# Patient Record
Sex: Female | Born: 1975 | Hispanic: No | Smoking: Never smoker
Health system: Southern US, Community
[De-identification: ages and names within clinical notes are randomized; demographics above are authoritative.]

## PROBLEM LIST (undated history)

## (undated) DIAGNOSIS — A64 Unspecified sexually transmitted disease: Secondary | ICD-10-CM

## (undated) DIAGNOSIS — R519 Headache, unspecified: Secondary | ICD-10-CM

## (undated) DIAGNOSIS — Z8669 Personal history of other diseases of the nervous system and sense organs: Secondary | ICD-10-CM

## (undated) DIAGNOSIS — T7840XA Allergy, unspecified, initial encounter: Secondary | ICD-10-CM

## (undated) DIAGNOSIS — F419 Anxiety disorder, unspecified: Secondary | ICD-10-CM

## (undated) DIAGNOSIS — F329 Major depressive disorder, single episode, unspecified: Secondary | ICD-10-CM

## (undated) DIAGNOSIS — R87619 Unspecified abnormal cytological findings in specimens from cervix uteri: Secondary | ICD-10-CM

## (undated) DIAGNOSIS — J45909 Unspecified asthma, uncomplicated: Secondary | ICD-10-CM

## (undated) DIAGNOSIS — R51 Headache: Secondary | ICD-10-CM

## (undated) DIAGNOSIS — F32A Depression, unspecified: Secondary | ICD-10-CM

## (undated) HISTORY — DX: Unspecified abnormal cytological findings in specimens from cervix uteri: R87.619

## (undated) HISTORY — PX: REDUCTION MAMMAPLASTY: SUR839

## (undated) HISTORY — DX: Personal history of other diseases of the nervous system and sense organs: Z86.69

## (undated) HISTORY — DX: Unspecified asthma, uncomplicated: J45.909

## (undated) HISTORY — DX: Allergy, unspecified, initial encounter: T78.40XA

## (undated) HISTORY — PX: BREAST EXCISIONAL BIOPSY: SUR124

## (undated) HISTORY — DX: Unspecified sexually transmitted disease: A64

## (undated) HISTORY — PX: COLPOSCOPY: SHX161

## (undated) HISTORY — PX: BREAST BIOPSY: SHX20

---

## 2001-08-10 HISTORY — PX: BREAST REDUCTION SURGERY: SHX8

## 2003-08-11 HISTORY — PX: OTHER SURGICAL HISTORY: SHX169

## 2004-08-10 DIAGNOSIS — R87619 Unspecified abnormal cytological findings in specimens from cervix uteri: Secondary | ICD-10-CM

## 2004-08-10 HISTORY — DX: Unspecified abnormal cytological findings in specimens from cervix uteri: R87.619

## 2012-05-12 ENCOUNTER — Other Ambulatory Visit: Payer: Self-pay | Admitting: Gynecology

## 2012-05-12 DIAGNOSIS — N644 Mastodynia: Secondary | ICD-10-CM

## 2012-05-16 ENCOUNTER — Ambulatory Visit
Admission: RE | Admit: 2012-05-16 | Discharge: 2012-05-16 | Disposition: A | Discharge status: Home / Self Care | Payer: Private Health Insurance - Indemnity | Source: Ambulatory Visit | Attending: Gynecology | Admitting: Gynecology

## 2012-05-16 DIAGNOSIS — N644 Mastodynia: Secondary | ICD-10-CM

## 2012-05-24 ENCOUNTER — Ambulatory Visit (INDEPENDENT_AMBULATORY_CARE_PROVIDER_SITE_OTHER): Payer: Private Health Insurance - Indemnity | Admitting: Family

## 2012-05-24 ENCOUNTER — Encounter: Payer: Self-pay | Admitting: Family

## 2012-05-24 VITALS — BP 124/80 | HR 122 | Temp 102.0°F | Ht 66.0 in | Wt 206.0 lb

## 2012-05-24 DIAGNOSIS — M791 Myalgia, unspecified site: Secondary | ICD-10-CM

## 2012-05-24 DIAGNOSIS — IMO0001 Reserved for inherently not codable concepts without codable children: Secondary | ICD-10-CM

## 2012-05-24 DIAGNOSIS — R509 Fever, unspecified: Secondary | ICD-10-CM

## 2012-05-24 LAB — CBC WITH DIFFERENTIAL/PLATELET
Basophils Absolute: 0 10*3/uL (ref 0.0–0.1)
Eosinophils Absolute: 0.1 10*3/uL (ref 0.0–0.7)
Eosinophils Relative: 1.3 % (ref 0.0–5.0)
HCT: 42.6 % (ref 36.0–46.0)
Lymphs Abs: 0.8 10*3/uL (ref 0.7–4.0)
MCHC: 32 g/dL (ref 30.0–36.0)
MCV: 85 fl (ref 78.0–100.0)
Monocytes Absolute: 0.7 10*3/uL (ref 0.1–1.0)
Neutrophils Relative %: 82.5 % — ABNORMAL HIGH (ref 43.0–77.0)
Platelets: 272 10*3/uL (ref 150.0–400.0)
RDW: 13.6 % (ref 11.5–14.6)
WBC: 9.2 10*3/uL (ref 4.5–10.5)

## 2012-05-24 LAB — BASIC METABOLIC PANEL
Calcium: 9 mg/dL (ref 8.4–10.5)
GFR: 118.38 mL/min (ref >60.00)
Glucose, Bld: 91 mg/dL (ref 70–99)
Potassium: 3.9 mEq/L (ref 3.5–5.1)
Sodium: 137 mEq/L (ref 135–145)

## 2012-05-24 LAB — POCT INFLUENZA A/B
Influenza A, POC: NEGATIVE
Influenza B, POC: NEGATIVE

## 2012-05-24 MED ORDER — AZITHROMYCIN 250 MG PO TABS: ORAL_TABLET | ORAL | Status: DC

## 2012-05-24 MED ORDER — HYDROCOD POLST-CHLORPHEN POLST 10-8 MG/5ML PO LQCR: 5.0000 mL | Freq: Two times a day (BID) | ORAL | Status: DC | PRN

## 2012-05-24 NOTE — Progress Notes (Signed)
Subjective:    Patient ID: Audrey Jackson, female    DOB: 08-19-1975, 36 y.o.   MRN: [id number]  HPI 36 year old new patient to the practice is in today with complaints of headache, fever, chills, cough, runny nose, decreased appetite x3 days. She's getting progressively worse. His taken over-the-counter Tylenol Cold and sinus, TheraFlu, Alka-Seltzer plus with no relief. She's a nonsmoker. Hurts in her chest when she coughs. She accompanied today by her 35 year old son.    Review of Systems  Constitutional: Positive for fever, appetite change and fatigue.  HENT: Positive for rhinorrhea.   Respiratory: Positive for cough.   Cardiovascular: Negative.   Gastrointestinal: Negative.  Negative for nausea, vomiting and abdominal pain.  Genitourinary: Negative.   Musculoskeletal: Negative.   Skin: Negative.   Neurological: Positive for headaches.  Psychiatric/Behavioral: Negative.    Past Medical History  Diagnosis Date  . Allergy   . Asthma   . Hx of migraines     History   Social History  . Marital Status: Divorced    Spouse Name: N/A    Number of Children: N/A  . Years of Education: N/A   Occupational History  . Not on file.   Social History Main Topics  . Smoking status: Never Smoker   . Smokeless tobacco: Not on file  . Alcohol Use: Yes  . Drug Use: No  . Sexually Active: Not on file   Other Topics Concern  . Not on file   Social History Narrative  . No narrative on file    Past Surgical History  Procedure Date  . Brain surgery     reduction    Family History  Problem Relation Age of Onset  . Alcohol abuse Mother   . Alcohol abuse Father   . Drug abuse Father   . Depression Father   . Bipolar disorder Father   . Diabetes Father   . Cancer Maternal Grandmother     breast    No Known Allergies  Current Outpatient Prescriptions on File Prior to Visit  Medication Sig Dispense Refill  . beclomethasone (QVAR) 80 MCG/ACT inhaler Inhale 1 puff into the  lungs as needed.      [name] Kitchen levocetirizine (XYZAL) 5 MG tablet Take 5 mg by mouth every evening.      . montelukast (SINGULAIR) 10 MG tablet Take 10 mg by mouth at bedtime.        BP 124/80  Pulse 122  Temp 102 F (38.9 C) (Oral)  Ht 5\' 6"  (1.676 m)  Wt 206 lb (93.441 kg)  BMI 33.25 kg/m2  SpO2 98%  LMP 10/12/2013chart    Objective:   Physical Exam  Constitutional: She is oriented to person, place, and time. She appears well-developed and well-nourished.  HENT:  Right Ear: External ear normal.  Left Ear: External ear normal.  Nose: Nose normal.  Mouth/Throat: Oropharynx is clear and moist.  Neck: Normal range of motion. Neck supple.  Cardiovascular: Normal rate, regular rhythm and normal heart sounds.   Pulmonary/Chest: Effort normal and breath sounds normal. She has no wheezes.  Abdominal: Soft. Bowel sounds are normal.  Neurological: She is alert and oriented to person, place, and time.  Skin: Skin is warm and dry.  Psychiatric: She has a normal mood and affect.          Assessment & Plan:  Assessment: Suspected influenza, myalgias, fever  Plan: Obtain CBC and BMP will notify patient pending results. Ordinarily I would send for CXR but  her son is unable to drive to [place] location. If no better, we will obtain CXR.  Will treat patient empirically for pneimonia. Zpak as directed. Tussionex as needed for cough.

## 2012-05-24 NOTE — Patient Instructions (Addendum)
Influenza Facts  Flu (influenza) is a contagious respiratory illness caused by the influenza viruses. It can cause mild to severe illness. While most healthy people recover from the flu without specific treatment and without complications, older people, young children, and people with certain health conditions are at higher risk for serious complications from the flu, including death.  CAUSES    The flu virus is spread from person to person by respiratory droplets from coughing and sneezing.   A person can also become infected by touching an object or surface with a virus on it and then touching their mouth, eye or nose.   Adults may be able to infect others from 1 day before symptoms occur and up to 7 days after getting sick. So it is possible to give someone the flu even before you know you are sick and continue to infect others while you are sick.  SYMPTOMS    Fever (usually high).   Headache.   Tiredness (can be extreme).   Cough.   Sore throat.   Runny or stuffy nose.   Body aches.   Diarrhea and vomiting may also occur, particularly in children.   These symptoms are referred to as "flu-like symptoms". A lot of different illnesses, including the common cold, can have similar symptoms.  DIAGNOSIS    There are tests that can determine if you have the flu as long you are tested within the first 2 or 3 days of illness.   A doctor's exam and additional tests may be needed to identify if you have a disease that is a complicating the flu.  RISKS AND COMPLICATIONS   Some of the complications caused by the flu include:   Bacterial pneumonia or progressive pneumonia caused by the flu virus.   Loss of body fluids (dehydration).   Worsening of chronic medical conditions, such as heart failure, asthma, or diabetes.   Sinus problems and ear infections.  HOME CARE INSTRUCTIONS    Seek medical care early on.   If you are at high risk from complications of the flu, consult your health-care provider as soon  as you develop flu-like symptoms. Those at high risk for complications include:   People 65 years or older.   People with chronic medical conditions, including diabetes.   Pregnant women.   Young children.   Your caregiver may recommend use of an antiviral medication to help treat the flu.   If you get the flu, get plenty of rest, drink a lot of liquids, and avoid using alcohol and tobacco.   You can take over-the-counter medications to relieve the symptoms of the flu if your caregiver approves. (Never give aspirin to children or teenagers who have flu-like symptoms, particularly fever).  PREVENTION   The single best way to prevent the flu is to get a flu vaccine each fall. Other measures that can help protect against the flu are:   Antiviral Medications   A number of antiviral drugs are approved for use in preventing the flu. These are prescription medications, and a doctor should be consulted before they are used.   Habits for Good Health   Cover your nose and mouth with a tissue when you cough or sneeze, throw the tissue away after you use it.   Wash your hands often with soap and water, especially after you cough or sneeze. If you are not near water, use an alcohol-based hand cleaner.   Avoid people who are sick.   If you get the   flu, stay home from work or school. Avoid contact with other people so that you do not make them sick, too.   Try not to touch your eyes, nose, or mouth as germs ore often spread this way.  IN CHILDREN, EMERGENCY WARNING SIGNS THAT NEED URGENT MEDICAL ATTENTION:   Fast breathing or trouble breathing.   Bluish skin color.   Not drinking enough fluids.   Not waking up or not interacting.   Being so irritable that the child does not want to be held.   Flu-like symptoms improve but then return with fever and worse cough.   Fever with a rash.  IN ADULTS, EMERGENCY WARNING SIGNS THAT NEED URGENT MEDICAL ATTENTION:   Difficulty breathing or shortness of breath.   Pain  or pressure in the chest or abdomen.   Sudden dizziness.   Confusion.   Severe or persistent vomiting.  SEEK IMMEDIATE MEDICAL CARE IF:   You or someone you know is experiencing any of the symptoms above. When you arrive at the emergency center,report that you think you have the flu. You may be asked to wear a mask and/or sit in a secluded area to protect others from getting sick.  MAKE SURE YOU:    Understand these instructions.   Monitor your condition.   Seek medical care if you are getting worse, or not improving.  Document Released: 07/30/2003 Document Revised: 10/19/2011 Document Reviewed: 04/25/2009  ExitCare Patient Information 2013 [employer].

## 2012-06-28 ENCOUNTER — Ambulatory Visit (INDEPENDENT_AMBULATORY_CARE_PROVIDER_SITE_OTHER): Payer: Private Health Insurance - Indemnity | Admitting: Family

## 2012-06-28 ENCOUNTER — Encounter: Payer: Self-pay | Admitting: Family

## 2012-06-28 DIAGNOSIS — M436 Torticollis: Secondary | ICD-10-CM

## 2012-06-28 MED ORDER — DICLOFENAC SODIUM 75 MG PO TBEC: 75.0000 mg | DELAYED_RELEASE_TABLET | Freq: Two times a day (BID) | ORAL | Status: DC

## 2012-06-28 MED ORDER — CYCLOBENZAPRINE HCL 10 MG PO TABS: 10.0000 mg | ORAL_TABLET | Freq: Three times a day (TID) | ORAL | Status: DC | PRN
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
## 2012-06-30 ENCOUNTER — Telehealth: Payer: Self-pay | Admitting: Family
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
## 2012-08-26 ENCOUNTER — Ambulatory Visit (INDEPENDENT_AMBULATORY_CARE_PROVIDER_SITE_OTHER)
Admission: RE | Admit: 2012-08-26 | Discharge: 2012-08-26 | Disposition: A | Discharge status: Home / Self Care | Payer: Private Health Insurance - Indemnity | Source: Ambulatory Visit | Attending: Family | Admitting: Family

## 2012-08-26 ENCOUNTER — Ambulatory Visit (INDEPENDENT_AMBULATORY_CARE_PROVIDER_SITE_OTHER): Payer: Private Health Insurance - Indemnity | Admitting: Family

## 2012-08-26 ENCOUNTER — Encounter: Payer: Self-pay | Admitting: Family

## 2012-08-26 VITALS — BP 114/80 | HR 107 | Wt 213.0 lb

## 2012-08-26 DIAGNOSIS — M25519 Pain in unspecified shoulder: Secondary | ICD-10-CM

## 2012-08-26 MED ORDER — DICLOFENAC SODIUM 75 MG PO TBEC: 75.0000 mg | DELAYED_RELEASE_TABLET | Freq: Two times a day (BID) | ORAL | Status: DC

## 2012-08-26 MED ORDER — KETOROLAC TROMETHAMINE 60 MG/2ML IM SOLN
60.0000 mg | Freq: Once | INTRAMUSCULAR | Status: AC
  Administered 2012-08-26: 60 mg via INTRAMUSCULAR
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
## 2012-08-26 NOTE — Patient Instructions (Addendum)
Strain A strain is an injury to a muscle or the tissue that connects muscles to bones (tendon). In a strain injury, the muscle or tendon is either stretched or torn. Muscles are more susceptible to strains if they cross two joints, such as:  Hamstrings.  Quadriceps.  Calves.  Biceps. There are three categories of strains:  A first-degree strain is a small tear in the muscle. There is no lengthening of the muscle, but pain may be present with contraction of the muscle.  A second-degree strain is a small tear in the muscle accompanied by lengthening of the muscle. Muscles with a second-degree strain are still able to function.  A third-degree strain is a complete tear of the muscle. Muscles with a third-degree strain cannot function properly. Strains often have bleeding and bruising within the muscle. SYMPTOMS   Pain, tenderness, redness or bruising, and swelling in the area of injury.  Loss of normal mobility of the injured joint. CAUSES  A sudden force exerted on a muscle or tendon that it cannot withstand usually causes strains. This may be due to a sudden overload of a contracted muscle, overuse, or sudden increase or change in activity.  RISK INCREASES WITH:  Trauma.  Poor strength and flexibility.  Failure to warm-up properly before activity.  Return to activity before healing is complete. PREVENTION  Warm-up and stretch properly before and activity.  Maintain physical fitness:  Joint flexibility.  Muscle strength.  Endurance and conditioning.  Strengthen weak muscles with exercises to prevent recurrence. PROGNOSIS  If treated properly, strains are usually curable. The time it takes to recover is related to the severity of the injury and usually varies from 2 to 8 weeks. RELATED COMPLICATIONS   Re-injury or recurrence of symptoms, permanent weakness.  Joint stiffness if the strain is severe and rehabilitation is incomplete.  Delayed healing or resolution of  symptoms if sports are resumed before rehabilitation is complete.  Excessive bleeding into muscle, especially if taking anti-inflammatory medicines. This can lead to delayed recovery and injury to nerves, muscle, and blood vessels; this is an emergency. TREATMENT  Treatment initially involves ice and medicine to help reduce pain and inflammation. Use of the affected muscle should be limited by a:  Brace.  Elastic bandage wrapping.  Splint.  Cast.  Sling. Strengthening and stretching exercises may be necessary after immobilization to prevent joint stiffness. These exercises may be completed at home or with a therapist. If the tendon is torn, then surgery may be necessary to repair it.  MEDICATION   Avoid aspirin or ibuprofen in the first 48 hours after the injury. These medicines may increase the tendency to bleed. During this time, you may take pain relievers, such as acetaminophen, that do not affect bleeding.  After the first 48 hours, if pain medicine is necessary, then nonsteroidal anti-inflammatory medicines, such as aspirin and ibuprofen, or other minor pain relievers, such as acetaminophen, are often recommended.  Do not take pain medicine within 7 days before surgery.  Prescription pain relievers may be prescribed. Use only as directed and only as much as you need  Ointments applied to the skin may be helpful. HEAT AND COLD  Cold treatment (icing) relieves pain and reduces inflammation. Cold treatment should be applied for 10 to 15 minutes every 2 to 3 hours for inflammation and pain and immediately after any activity that aggravates your symptoms. Use ice packs or massage the area with a piece of ice (ice massage).  Heat treatment may be   used prior to performing the stretching and strengthening activities prescribed by your caregiver, physical therapist, or athletic trainer. Use a heat pack or soak your injury in warm water. SEEK MEDICAL CARE IF:   Symptoms get worse or do  not improve despite treatment.  Pain becomes intolerable.  You experience numbness or tingling.  Toes or fingernails become cold or develop a blue, gray, or dusky color.  New, unexplained symptoms develop (drugs used in treatment may produce side effects). Document Released: 07/27/2005 Document Revised: 10/19/2011 Document Reviewed: 11/08/2008 Exit[employer] Patient Information 2013 [employer].  

## 2012-11-14 ENCOUNTER — Encounter: Payer: Self-pay | Admitting: Family

## 2012-11-14 ENCOUNTER — Ambulatory Visit (INDEPENDENT_AMBULATORY_CARE_PROVIDER_SITE_OTHER): Payer: Private Health Insurance - Indemnity | Admitting: Family

## 2012-11-14 VITALS — BP 98/60 | HR 88 | Temp 99.1°F | Wt 202.0 lb

## 2012-11-14 DIAGNOSIS — J309 Allergic rhinitis, unspecified: Secondary | ICD-10-CM

## 2012-11-14 DIAGNOSIS — J320 Chronic maxillary sinusitis: Secondary | ICD-10-CM

## 2012-11-14 MED ORDER — AZITHROMYCIN 250 MG PO TABS: ORAL_TABLET | ORAL | Status: DC

## 2012-11-14 MED ORDER — METHYLPREDNISOLONE 4 MG PO KIT: PACK | ORAL | Status: AC

## 2012-11-14 MED ORDER — FLUCONAZOLE 150 MG PO TABS: 150.0000 mg | ORAL_TABLET | Freq: Once | ORAL | Status: DC

## 2012-11-14 NOTE — Progress Notes (Signed)
Subjective:    Patient ID: Audrey Jackson, female    DOB: 1976-05-09, 37 y.o.   MRN: [id number]  HPI 37 year old [place] American female, nonsmoker is in with complaints of sneezing, cough, congestion, sinus pressure and pain, fever ongoing x9 days. She chronically takes size all and Singulair for asthma and allergies. Her symptoms are no better.   Review of Systems  Constitutional: Negative.   HENT: Positive for congestion, sore throat and sinus pressure.   Respiratory: Positive for cough. Negative for wheezing.   Cardiovascular: Negative.   Gastrointestinal: Negative.   Skin: Negative.   Allergic/Immunologic: Positive for environmental allergies.  Hematological: Negative.   Psychiatric/Behavioral: Negative.    Past Medical History  Diagnosis Date  . Allergy   . Asthma   . Hx of migraines     History   Social History  . Marital Status: Divorced    Spouse Name: N/A    Number of Children: N/A  . Years of Education: N/A   Occupational History  . Not on file.   Social History Main Topics  . Smoking status: Never Smoker   . Smokeless tobacco: Not on file  . Alcohol Use: Yes  . Drug Use: No  . Sexually Active: Not on file   Other Topics Concern  . Not on file   Social History Narrative  . No narrative on file    Past Surgical History  Procedure Laterality Date  . Brain surgery      reduction    Family History  Problem Relation Age of Onset  . Alcohol abuse Mother   . Alcohol abuse Father   . Drug abuse Father   . Depression Father   . Bipolar disorder Father   . Diabetes Father   . Cancer Maternal Grandmother     breast    No Known Allergies  Current Outpatient Prescriptions on File Prior to Visit  Medication Sig Dispense Refill  . beclomethasone (QVAR) 80 MCG/ACT inhaler Inhale 1 puff into the lungs as needed.      . cyclobenzaprine (FLEXERIL) 10 MG tablet Take 1 tablet (10 mg total) by mouth 3 (three) times daily as needed for muscle spasms.  30  tablet  0  . levocetirizine (XYZAL) 5 MG tablet Take 5 mg by mouth every evening.      . montelukast (SINGULAIR) 10 MG tablet Take 10 mg by mouth at bedtime.      . chlorpheniramine-HYDROcodone (TUSSIONEX PENNKINETIC ER) 10-8 MG/5ML LQCR Take 5 mLs by mouth every 12 (twelve) hours as needed.  140 mL  0  . diclofenac (VOLTAREN) 75 MG EC tablet Take 1 tablet (75 mg total) by mouth 2 (two) times daily.  60 tablet  1   No current facility-administered medications on file prior to visit.    BP 98/60  Pulse 88  Temp(Src) 99.1 F (37.3 C) (Oral)  Wt 202 lb (91.627 kg)  BMI 32.62 kg/m2  SpO2 98%chart    Objective:   Physical Exam  Constitutional: She is oriented to person, place, and time. She appears well-developed and well-nourished.  HENT:  Right Ear: External ear normal.  Left Ear: External ear normal.  Mouth/Throat: Oropharynx is clear and moist.  Nasal congestion noted bilaterally. Turbinates red and swollen.  Neck: Neck supple.  Cardiovascular: Normal rate, regular rhythm and normal heart sounds.   Pulmonary/Chest: Effort normal and breath sounds normal.  Neurological: She is alert and oriented to person, place, and time.  Skin: Skin is warm and dry.  Psychiatric: She has a normal mood and affect.          Assessment & Plan:  Assessment:  1. Acute sinusitis 2. Allergic rhinitis  Plan: Z-Pak as directed. Medrol Dosepak as directed. Diflucan 150 mg one by mouth x1 as needed for vaginal yeast infection. Patient call the office if symptoms worsen or persist. Recheck a schedule, and as needed.

## 2012-11-14 NOTE — Patient Instructions (Addendum)

## 2012-11-29 ENCOUNTER — Telehealth: Payer: Self-pay | Admitting: Family

## 2012-11-29 ENCOUNTER — Ambulatory Visit: Payer: Self-pay | Admitting: Family

## 2012-11-29 NOTE — Telephone Encounter (Signed)
Patient Information:  Caller Name: Bentlie  Phone: [phone]  Patient: Audrey Jackson  Gender: Female  DOB: 01/27/1976  Age: 37 Years  PCP: Adline Mango [facility])  Pregnant: No  Office Follow Up:  Does the office need to follow up with this patient?: No  Instructions For The Office: N/A  RN Note:  pt also reports she has just gotten back from a cruise (1 week ago) and she is still having vertigo symptoms  Symptoms  Reason For Call & Symptoms: seen in the office 3 weeks ago (11/14/12)  for sinus infection:  Sneezing, coughing, pressure  Reviewed Health History In EMR: Yes  Reviewed Medications In EMR: Yes  Reviewed Allergies In EMR: Yes  Reviewed Surgeries / Procedures: Yes  Date of Onset of Symptoms: 11/22/2012 OB / GYN:  LMP: 11/17/2012  Guideline(s) Used:  Sinus Pain and Congestion  Disposition Per Guideline:   See Today in Office  Reason For Disposition Reached:   Earache  Advice Given:  N/A  Patient Will Follow Care Advice:  YES  Appointment Scheduled:  11/29/2012 14:30:00 Appointment Scheduled Provider:  Adline Mango [facility])

## 2013-01-25 ENCOUNTER — Other Ambulatory Visit: Payer: Self-pay | Admitting: Family

## 2013-01-25 DIAGNOSIS — J329 Chronic sinusitis, unspecified: Secondary | ICD-10-CM

## 2013-06-14 ENCOUNTER — Encounter: Payer: Self-pay | Admitting: Gynecology

## 2013-06-16 ENCOUNTER — Encounter: Payer: Self-pay | Admitting: Gynecology

## 2013-06-16 ENCOUNTER — Ambulatory Visit (INDEPENDENT_AMBULATORY_CARE_PROVIDER_SITE_OTHER): Payer: BC Managed Care – PPO | Admitting: Gynecology

## 2013-06-16 VITALS — BP 110/74 | Resp 12 | Ht 66.5 in | Wt 212.0 lb

## 2013-06-16 DIAGNOSIS — Z9889 Other specified postprocedural states: Secondary | ICD-10-CM

## 2013-06-16 DIAGNOSIS — G43109 Migraine with aura, not intractable, without status migrainosus: Secondary | ICD-10-CM | POA: Insufficient documentation

## 2013-06-16 DIAGNOSIS — Z01419 Encounter for gynecological examination (general) (routine) without abnormal findings: Secondary | ICD-10-CM

## 2013-06-16 DIAGNOSIS — Z3009 Encounter for other general counseling and advice on contraception: Secondary | ICD-10-CM

## 2013-06-16 NOTE — Patient Instructions (Signed)

## 2013-06-16 NOTE — Progress Notes (Signed)
37 y.o. Divorced [place] American female   [phone] here for annual exam. Pt is currently sexually active.  Pt is interested in replacing Mirena IUD which was removed on time, no issues.  No partner change, negative GC/CTM.  Migraines stable, stopped allergy medications last 90m-doing well  Patient's last menstrual period was 06/12/2013.          Sexually active: yes  The current method of family planning is condoms  Exercising: no  The patient does not participate in regular exercise at present. Last pap: 05/03/12 NEG HPV Alcohol: 1/wk Tobacco: no BSE: yes  Hgb: ; Urine: Negative    Health Maintenance  Topic Date Due  . Influenza Vaccine  03/10/2013  . Pap Smear  05/04/2015  . Tetanus/tdap  05/25/2019    Family History  Problem Relation Age of Onset  . Alcohol abuse Mother   . Alcohol abuse Father   . Drug abuse Father   . Depression Father   . Bipolar disorder Father   . Diabetes Father   . Breast cancer Maternal Grandmother     There are no active problems to display for this patient.   Past Medical History  Diagnosis Date  . Allergy   . Asthma   . Hx of migraines   . STD (sexually transmitted disease) 20 years ago    Chamydia  . History of migraine     Past Surgical History  Procedure Laterality Date  . Brain surgery      reduction  . Breast reduction surgery  2003     H to DD  . Breast biopsy      x2  . 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  Current Outpatient Prescriptions  Medication Sig Dispense Refill  . azithromycin (ZITHROMAX) 250 MG tablet 2 tabs po x 1, then 1 tab a day x 4 more days.  6 tablet  0  . beclomethasone (QVAR) 80 MCG/ACT inhaler Inhale 1 puff into the lungs as needed.      . chlorpheniramine-HYDROcodone (TUSSIONEX PENNKINETIC ER) 10-8 MG/5ML LQCR Take 5 mLs by mouth every 12 (twelve) hours as needed.  140 mL  0  . cyclobenzaprine (FLEXERIL) 10 MG tablet Take  1 tablet (10 mg total) by mouth 3 (three) times daily as needed for muscle spasms.  30 tablet  0  . diclofenac (VOLTAREN) 75 MG EC tablet Take 1 tablet (75 mg total) by mouth 2 (two) times daily.  60 tablet  1  . fluconazole (DIFLUCAN) 150 MG tablet Take 1 tablet (150 mg total) by mouth once.  1 tablet  0  . levocetirizine (XYZAL) 5 MG tablet Take 5 mg by mouth every evening.      . montelukast (SINGULAIR) 10 MG tablet Take 10 mg by mouth at bedtime.      . Oxymetazoline HCl (QC NASAL RELIEF SINUS NA) Place into the nose.       No current facility-administered medications for this visit.    ROS: Pertinent items are noted in HPI.  Exam:    BP 110/74  Resp 12  Ht 5' 6.5" (1.689 m)  Wt 212 lb (96.163 kg)  BMI 33.71 kg/m2  LMP 06/12/2013 Weight change: @WEIGHTCHANGE @ Last 3 height recordings:  Ht Readings from Last 3 Encounters:  06/16/13 5' 6.5" (1.689 m)  05/24/12 5\' 6"  (1.676 m)   General appearance: alert, cooperative and appears stated age Head: Normocephalic,  without obvious abnormality, atraumatic Neck: no adenopathy, no carotid bruit, no JVD, supple, symmetrical, trachea midline and thyroid not enlarged, symmetric, no tenderness/mass/nodules Lungs: clear to auscultation bilaterally Breasts: normal appearance, no masses or tenderness, s/p reduction Heart: regular rate and rhythm, S1, S2 normal, no murmur, click, rub or gallop Abdomen: soft, non-tender; bowel sounds normal; no masses,  no organomegaly Extremities: extremities normal, atraumatic, no cyanosis or edema Skin: Skin color, texture, turgor normal. No rashes or lesions Lymph nodes: Cervical, supraclavicular, and axillary nodes normal. no inguinal nodes palpated Neurologic: Grossly normal   Pelvic: External genitalia:  no lesions              Urethra: normal appearing urethra with no masses, tenderness or lesions              Bartholins and Skenes: normal                 Vagina: normal appearing vagina with normal  color and discharge, no lesions              Cervix: normal appearance              Pap taken: no        Bimanual Exam:  Uterus:  uterus is normal size, shape, consistency and nontender                                      Adnexa:    normal adnexa in size, nontender and no masses                                      Rectovaginal: Confirms                                      Anus:  normal sphincter tone, no lesions  A: well woman Contraceptive management     P: mammogram at 41 pap smear not done today Info on Mirena reiterated.  Will return with cycle counseled on breast self exam, adequate intake of calcium and vitamin D, diet and exercise return annually or prn   An After Visit Summary was printed and given to the patient.

## 2013-06-26 ENCOUNTER — Telehealth: Payer: Self-pay | Admitting: Gynecology

## 2013-06-26 NOTE — Telephone Encounter (Signed)
LMTCB to discuss insurance benefits for IUD insertion.

## 2013-06-27 NOTE — Telephone Encounter (Signed)
Spoke with patient. Agreeable to benefits and will call with the first day of her cycle.

## 2013-08-15 ENCOUNTER — Ambulatory Visit (INDEPENDENT_AMBULATORY_CARE_PROVIDER_SITE_OTHER): Payer: BC Managed Care – PPO | Admitting: Gynecology

## 2013-08-15 VITALS — BP 116/78 | HR 70 | Ht 66.5 in | Wt 215.0 lb

## 2013-08-15 DIAGNOSIS — N898 Other specified noninflammatory disorders of vagina: Secondary | ICD-10-CM

## 2013-08-15 DIAGNOSIS — R350 Frequency of micturition: Secondary | ICD-10-CM

## 2013-08-15 DIAGNOSIS — N3000 Acute cystitis without hematuria: Secondary | ICD-10-CM

## 2013-08-15 LAB — POCT URINALYSIS DIPSTICK
Nitrite, UA: NEGATIVE
Urobilinogen, UA: NEGATIVE
pH, UA: 5

## 2013-08-15 LAB — POCT WET PREP (WET MOUNT): Clue Cells Wet Prep Whiff POC: NEGATIVE

## 2013-08-15 MED ORDER — CIPROFLOXACIN HCL 500 MG PO TABS: 500.0000 mg | ORAL_TABLET | Freq: Two times a day (BID) | ORAL | Status: DC

## 2013-08-15 MED ORDER — FLUCONAZOLE 150 MG PO TABS: 150.0000 mg | ORAL_TABLET | Freq: Once | ORAL | Status: DC

## 2013-08-15 MED ORDER — PHENAZOPYRIDINE HCL 200 MG PO TABS: 200.0000 mg | ORAL_TABLET | Freq: Three times a day (TID) | ORAL | Status: DC | PRN

## 2013-08-15 NOTE — Progress Notes (Signed)
  Subjective:    Audrey Jackson is a 38 y.o. female who complains of burning with urination, frequency, hesitancy and urgency. She has had symptoms for 2 days. Patient also complains of none. Patient denies back pain, fever and vaginal discharge. Patient does not have a history of recurrent UTI. Patient does not have a history of pyelonephritis.   The following portions of the patient's history were reviewed and updated as appropriate: allergies, current medications, past family history, past medical history, past social history, past surgical history and problem list.  Review of Systems Pertinent items are noted in HPI.    Objective:    BP 116/78  Pulse 70  Ht 5' 6.5" (1.689 m)  Wt 215 lb (97.523 kg)  BMI 34.19 kg/m2 General appearance: alert, cooperative and appears stated age Back: no CVAT Abdomen: soft, no suprapubic tenderness Pelvic: cervix normal in appearance, external genitalia normal, no adnexal masses or tenderness, no cervical motion tenderness, rectovaginal septum normal, uterus normal size, shape, and consistency and thick white discharge  Laboratory:  Urine dipstick: 2+ for leukocyte esterase.   Micro exam: not done.    Assessment:    Acute cystitis     Plan:    Medications: ciprofloxacin. Maintain adequate hydration. Follow up if symptoms not improving, and as needed.  Pyridium Diflucan for yeast infection post abx

## 2013-08-16 LAB — URINALYSIS, MICROSCOPIC ONLY
CRYSTALS: NONE SEEN
Casts: NONE SEEN

## 2013-08-16 LAB — URINE CULTURE
COLONY COUNT: NO GROWTH
ORGANISM ID, BACTERIA: NO GROWTH

## 2013-08-28 ENCOUNTER — Telehealth: Payer: Self-pay | Admitting: Gynecology

## 2013-08-28 NOTE — Telephone Encounter (Signed)
Pt started cycle on Saturday and calling to schedule iud procedure.

## 2013-08-28 NOTE — Telephone Encounter (Signed)
Called patient. Offered her an appointment for procedure tomorrow morning 01.20.2015 @9 :30. Patient declined appointment stating that she is only available today for an appointment. I explained that Dr. Charlies Constable schedule is completely booked today. She will call back with next cycle.

## 2013-09-12 ENCOUNTER — Ambulatory Visit (INDEPENDENT_AMBULATORY_CARE_PROVIDER_SITE_OTHER): Payer: BC Managed Care – PPO | Admitting: Gynecology

## 2013-09-12 ENCOUNTER — Telehealth: Payer: Self-pay | Admitting: Gynecology

## 2013-09-12 ENCOUNTER — Encounter: Payer: Self-pay | Admitting: Gynecology

## 2013-09-12 ENCOUNTER — Other Ambulatory Visit (INDEPENDENT_AMBULATORY_CARE_PROVIDER_SITE_OTHER): Payer: BC Managed Care – PPO

## 2013-09-12 ENCOUNTER — Other Ambulatory Visit: Payer: Self-pay | Admitting: *Deleted

## 2013-09-12 VITALS — BP 129/77 | HR 78 | Temp 98.7°F | Resp 20 | Ht 66.5 in | Wt 214.0 lb

## 2013-09-12 DIAGNOSIS — R102 Pelvic and perineal pain: Secondary | ICD-10-CM

## 2013-09-12 DIAGNOSIS — N9489 Other specified conditions associated with female genital organs and menstrual cycle: Secondary | ICD-10-CM

## 2013-09-12 DIAGNOSIS — N949 Unspecified condition associated with female genital organs and menstrual cycle: Secondary | ICD-10-CM

## 2013-09-12 LAB — CBC WITH DIFFERENTIAL/PLATELET
HCT: 42.4 % (ref 36.0–46.0)
Hemoglobin: 13.3 g/dL (ref 12.0–15.0)
Lymphocytes Relative: 26 % (ref 12–46)
Lymphs Abs: 2.3 10*3/uL (ref 0.7–4.0)
MCH: 25.6 pg — ABNORMAL LOW (ref 26.0–34.0)
MCHC: 31.4 g/dL (ref 30.0–36.0)
MCV: 81.7 fL (ref 78.0–100.0)
Monocytes Absolute: 0.6 10*3/uL (ref 0.1–1.0)
Monocytes Relative: 7 % (ref 3–12)
Neutro Abs: 5.8 10*3/uL (ref 1.7–7.7)
Neutrophils Relative %: 67 % (ref 43–77)
Platelets: 309 10*3/uL (ref 150–400)
RBC: 5.19 MIL/uL — ABNORMAL HIGH (ref 3.87–5.11)
RDW: 13.6 % (ref 11.5–15.5)
WBC: 8.7 10*3/uL (ref 4.0–10.5)

## 2013-09-12 LAB — POCT URINALYSIS DIPSTICK
BILIRUBIN UA: NEGATIVE
Glucose, UA: NEGATIVE
Ketones, UA: NEGATIVE
LEUKOCYTES UA: NEGATIVE
Nitrite, UA: NEGATIVE
PH UA: 8
Protein, UA: NEGATIVE
RBC UA: NEGATIVE
Urobilinogen, UA: NEGATIVE

## 2013-09-12 MED ORDER — HYDROCODONE-ACETAMINOPHEN 5-325 MG PO TABS: 1.0000 | ORAL_TABLET | Freq: Four times a day (QID) | ORAL | Status: DC | PRN

## 2013-09-12 MED ORDER — METOCLOPRAMIDE HCL 10 MG PO TABS: 10.0000 mg | ORAL_TABLET | Freq: Four times a day (QID) | ORAL | Status: DC | PRN

## 2013-09-12 NOTE — Telephone Encounter (Signed)
Patient says she is having a lot of pelvic pain and back pain (feels like labor  NOT PREGNANT).

## 2013-09-12 NOTE — Addendum Note (Signed)
Addended by: Elveria Rising on: 09/12/2013 04:22 PM   Modules accepted: Level of Service

## 2013-09-12 NOTE — Addendum Note (Signed)
Addended by: Elveria Rising on: 09/12/2013 02:36 PM   Modules accepted: Orders

## 2013-09-12 NOTE — Progress Notes (Addendum)
      Pt seen back later today, her u/s shows a cl cyst on the left where she is tender.  There is no free fluid in the pelvis, 4 small fibroids are noted, kidneys are normal. Images were reviewed with the patient,  Her cbc came back with normal wbc-8.7, normal differential, h/h 13.3/42.4, plt 309  abdomino-Pelvic pain. No evidence of acute infection, no acute pelvic process, normal wbc and urine. Pt assured We will treat with vicoden and reglan prn. We have asked her to return to office on 09/15/13, to call before if she gets worse, she agrees

## 2013-09-12 NOTE — Telephone Encounter (Signed)
Patient c/o four days of pelvic pain, radiates to back pain with bloating and feelings of pain/pressure when urinating. On Cipro one month ago for UTI. States pain has worsened over last two days.  Appointment scheduled for 11:30.  Called patient back and left message to come to office as soon as she can for work in.   Routing to provider for final review. Patient agreeable to disposition. Will close encounter

## 2013-09-12 NOTE — Telephone Encounter (Signed)
There is now a 1000 on Friday, offer this or 1100.

## 2013-09-12 NOTE — Telephone Encounter (Signed)
Appointment time corrected in computer.    Routing to provider for final review. Patient agreeable to disposition. Will close encounter

## 2013-09-12 NOTE — Telephone Encounter (Signed)
Per Dr.Lathrop patient needs to come in on Friday 09/15/13 for a follow up. There are no available slots. Pt can come any time after 10 am on Friday. Please schedule.

## 2013-09-12 NOTE — Telephone Encounter (Signed)
Pt will come in at 11;00 am on Friday 09/15/13 there is not a slot for 11am so I put her in at the 10am slot/RD

## 2013-09-12 NOTE — Progress Notes (Addendum)
Subjective:     Patient ID: Audrey Jackson, female   DOB: 1975-11-15, 38 y.o.   MRN: [id number]  HPI Comments: Pt here reporting genernalized malaise for 4d.  Pt states that she has abdominal pain, back pain with abdominal distension.  Pt is having urinary urgency and dysuria small volume and incontinence.  Pt reports LMP cam e on time but that it only lasted 1d before it stopped, she was planning to get her IUD placed this cycle but didn;t due to the short nature of her cycle.   Pelvic Pain The patient's primary symptoms include pelvic pain. Associated symptoms include abdominal pain, back pain and diarrhea. Pertinent negatives include no chills, constipation, fever, flank pain, hematuria, nausea or vomiting.     Review of Systems  Constitutional: Positive for diaphoresis and appetite change (decreased). Negative for fever, chills and fatigue.  Gastrointestinal: Positive for abdominal pain, diarrhea and abdominal distention. Negative for nausea, vomiting and constipation.  Genitourinary: Positive for enuresis (cannot hold urine), difficulty urinating, pelvic pain and dyspareunia. Negative for hematuria and flank pain.  Musculoskeletal: Positive for back pain. Negative for arthralgias, joint swelling and myalgias.       Objective:   Physical Exam  Nursing note and vitals reviewed. Constitutional: She is oriented to person, place, and time. Vital signs are normal. She appears ill. She appears distressed (mild).  Cardiovascular: Normal rate, regular rhythm and normal heart sounds.   Pulmonary/Chest: Effort normal and breath sounds normal.  Abdominal: Soft. She exhibits distension. There is tenderness. There is rebound (left lower quadrant). There is no guarding.  Genitourinary: There is no rash, tenderness or lesion on the right labia. There is no rash, tenderness or lesion on the left labia. Uterus is tender. Cervix exhibits motion tenderness. Cervix exhibits no discharge and no friability.  Right adnexum displays no tenderness and no fullness. Left adnexum displays tenderness (guarding ) and fullness. There is tenderness (nonspecific) around the vagina.  Lymphadenopathy:       Right: No inguinal adenopathy present.       Left: No inguinal adenopathy present.  Neurological: She is alert and oriented to person, place, and time.  Skin: Skin is warm. She is diaphoretic (sweaty).       Assessment:     Abdomino-pelvic pain  Irregular cycle     Plan:     Cbc with diff stat GC/CTM from cervix PUS scheduled for this afternoon Pt to remain npo for now-agrees     Total time spent with pt between both exams > 47m, >50% face to face

## 2013-09-13 LAB — HCG, QUANTITATIVE, PREGNANCY

## 2013-09-14 LAB — IPS N GONORRHOEA AND CHLAMYDIA BY PCR

## 2013-09-15 ENCOUNTER — Encounter: Payer: Self-pay | Admitting: Gynecology

## 2013-09-15 ENCOUNTER — Ambulatory Visit (INDEPENDENT_AMBULATORY_CARE_PROVIDER_SITE_OTHER): Payer: BC Managed Care – PPO | Admitting: Gynecology

## 2013-09-15 VITALS — BP 102/72 | Temp 98.7°F | Resp 16 | Ht 66.5 in

## 2013-09-15 DIAGNOSIS — N949 Unspecified condition associated with female genital organs and menstrual cycle: Secondary | ICD-10-CM

## 2013-09-15 DIAGNOSIS — N710 Acute inflammatory disease of uterus: Secondary | ICD-10-CM

## 2013-09-15 DIAGNOSIS — R102 Pelvic and perineal pain: Secondary | ICD-10-CM

## 2013-09-15 DIAGNOSIS — D259 Leiomyoma of uterus, unspecified: Secondary | ICD-10-CM

## 2013-09-15 LAB — CBC WITH DIFFERENTIAL/PLATELET
BASOS ABS: 0 10*3/uL (ref 0.0–0.1)
Basophils Relative: 1 % (ref 0–1)
Eosinophils Absolute: 0.2 10*3/uL (ref 0.0–0.7)
Eosinophils Relative: 3 % (ref 0–5)
HEMATOCRIT: 38.2 % (ref 36.0–46.0)
Hemoglobin: 12.8 g/dL (ref 12.0–15.0)
LYMPHS ABS: 2.3 10*3/uL (ref 0.7–4.0)
LYMPHS PCT: 28 % (ref 12–46)
MCH: 26.8 pg (ref 26.0–34.0)
MCHC: 33.5 g/dL (ref 30.0–36.0)
MCV: 79.9 fL (ref 78.0–100.0)
MONO ABS: 0.7 10*3/uL (ref 0.1–1.0)
Monocytes Relative: 9 % (ref 3–12)
Neutro Abs: 5 10*3/uL (ref 1.7–7.7)
Neutrophils Relative %: 59 % (ref 43–77)
PLATELETS: 306 10*3/uL (ref 150–400)
RBC: 4.78 MIL/uL (ref 3.87–5.11)
RDW: 14.6 % (ref 11.5–15.5)
WBC: 8.3 10*3/uL (ref 4.0–10.5)

## 2013-09-15 MED ORDER — DOXYCYCLINE HYCLATE 100 MG PO CAPS: 100.0000 mg | ORAL_CAPSULE | Freq: Two times a day (BID) | ORAL | Status: DC

## 2013-09-15 MED ORDER — METRONIDAZOLE 500 MG PO TABS: 500.0000 mg | ORAL_TABLET | Freq: Two times a day (BID) | ORAL | Status: DC

## 2013-09-15 NOTE — Progress Notes (Signed)
Subjective:     Patient ID: Audrey Jackson, female   DOB: 1975-12-16, 38 y.o.   MRN: [id number]  HPI Comments: Pt asked to return to office today for follow up of nonspecific pelvic pain.  Pt had a normal wbc without shift, normal h/h, no acute process on PUS, negative u/a.  Pt was given vicoden which she has been using after work.  Pt states that she felt better yesterday but didn't feel well this am.  Pt had GC/CTM cultures from the cervix which returned negative today as well.  Pt denies nausea or vomiting.  She states pain is mostly in her back, no change in appetitie    Review of Systems  Constitutional: Negative for fever, chills and appetite change.  Gastrointestinal: Negative for nausea and vomiting.  Genitourinary: Negative for dysuria, frequency, vaginal bleeding and vaginal discharge.       Objective:   Physical Exam  Nursing note and vitals reviewed. Constitutional: She appears well-developed and well-nourished. No distress.  Abdominal: Soft. Bowel sounds are normal. She exhibits no distension and no mass. There is no tenderness. There is no rebound and no guarding.  Skin: Skin is warm and dry. She is not diaphoretic.  Pelvic exam: VULVA: normal appearing vulva with no masses, tenderness or lesions, VAGINA: vaginal tenderness , vaginal discharge - white, copious and thin, WET MOUNT done - results: negative for pathogens, normal epithelial cells, pH 4.5, CERVIX: normal appearing cervix without discharge or lesions, cervical motion tenderness present, UTERUS: tenderness moderate, ADNEXA: tenderness left, no fullness.      Assessment:     Nonspecific pelvic pain, negative evaluation to date     Plan:     Will repeat cbc with diff Presumptively treat for endometritis-flagyl and doxy f/u 1w or sooner if needed precautions given

## 2013-11-14 ENCOUNTER — Telehealth: Payer: Self-pay | Admitting: Gynecology

## 2013-11-14 NOTE — Telephone Encounter (Signed)
Patient started her menstrual cycle today and is wanting to get her IUD placed as soon as possible.

## 2013-11-14 NOTE — Telephone Encounter (Signed)
Spoke with patient. Patient started menses today and would like to schedule IUD insertion. Patient requesting 4/8 after 1 pm or before 1 pm on 4/10. Appointment made for 11:30 am on 4/10 time per Dr.Lathrop. Patient is agreeable to date and time. Pre procedure instructions given.  Motrin instructions given. Motrin=Advil=Ibuprofen, 800 mg one hour before appointment. Eat a meal and hydrate well before appointment. Patient verbalizes understanding.  Routing to provider for final review. Patient agreeable to disposition. Will close encounter.

## 2013-11-17 ENCOUNTER — Ambulatory Visit (INDEPENDENT_AMBULATORY_CARE_PROVIDER_SITE_OTHER): Payer: BC Managed Care – PPO | Admitting: Gynecology

## 2013-11-17 ENCOUNTER — Encounter: Payer: Self-pay | Admitting: Gynecology

## 2013-11-17 VITALS — BP 118/80 | Resp 20 | Ht 66.5 in | Wt 215.0 lb

## 2013-11-17 DIAGNOSIS — Z30430 Encounter for insertion of intrauterine contraceptive device: Secondary | ICD-10-CM

## 2013-11-17 DIAGNOSIS — Z3009 Encounter for other general counseling and advice on contraception: Secondary | ICD-10-CM

## 2013-11-17 NOTE — Patient Instructions (Signed)
IUD PLACEMENT POST PROCEDURE INSTRUCTIONS  1. You may take Ibuprofen, Aleve or Tylenol for pain as needed.      Cramping should resolve within 24 hours.  2. You may have a small amount of spotting. You should wear a mini pad for the next few days  3. You may have intercourse after 24 hours. If you are using this for birth control, it is effective immediately.  4. You need to call if you have any pelvic pain, fever, heavy bleeding or foul smelling vaginal discharge. Irregular bleeding is common the first several months after having an IUD placed. You do not need to for this reason unless you are                     concerned.   5. Shower or bathe as normal  6. You should have a follow-up appointment in 4-8 weeks for a re-check to make sure you are not having any problems.  

## 2013-11-17 NOTE — Progress Notes (Signed)
50 yrs Married [place] American female presents for  insertion of Mirena. Denies any vaginal symptoms or STD concerns.  LMP: 11/14/13  Patient read information regarding IUD insertion.  All questions addressed.    Healthy female,time, place and personnormal menses, no abnormal bleeding, pelvic pain or discharge, no breast pain or new or enlarging lumps on self exam Abdomen: soft, non-tender Groinno inguinal nodes palpated  Pelvic exam: Vulva;normal female genitalia  Vagina:normal vagina  Cervix:Non-tender, Negative CMT, no lesions or redness, nulliparous/parous os  Uterus:normal shape, position and consistency   Lab:no pathogens  Procedure:  Speculum inserted into vagina. Cervix visualized and cleansed with betadine solution X 3. Xylocaine jelly placed. Tenaculum placed on cervix at 12 o'clock position(s).  Uterus sounded to 9 centimeters.  IUD removed from sterile packet and under sterile conditions inserted to fundus of uterus.  Introducer removed without difficulty.  IUD string trimmed to 3 centimeters.  Remainder string given to patient to feel for identification.  Tenaculum removed.  No bleeding noted.  Speculum removed.  Uterus palpated normal.  Patient tolerated procedure well.  A: Insertion of Mirena, Lot # [id number], Expiration date 8/17   P:  Instructions and warnings signs given.       IUD identification card given with IUD removal 4/20120       Return visit 46m

## 2013-11-27 HISTORY — PX: INTRAUTERINE DEVICE (IUD) INSERTION: SHX5877

## 2014-01-29 ENCOUNTER — Ambulatory Visit (INDEPENDENT_AMBULATORY_CARE_PROVIDER_SITE_OTHER): Payer: BC Managed Care – PPO | Admitting: Family

## 2014-01-29 ENCOUNTER — Encounter: Payer: Self-pay | Admitting: Family

## 2014-01-29 VITALS — BP 100/80 | HR 83 | Temp 98.5°F | Wt 218.0 lb

## 2014-01-29 DIAGNOSIS — J309 Allergic rhinitis, unspecified: Secondary | ICD-10-CM

## 2014-01-29 MED ORDER — FLUTICASONE PROPIONATE 50 MCG/ACT NA SUSP: 2.0000 | Freq: Every day | NASAL | Status: DC

## 2014-01-29 NOTE — Progress Notes (Signed)
Pre visit review using our clinic review tool, if applicable. No additional management support is needed unless otherwise documented below in the visit note. 

## 2014-01-29 NOTE — Patient Instructions (Addendum)
OTC Zyrtec Qday   Allergic Rhinitis Allergic rhinitis is when the mucous membranes in the nose respond to allergens. Allergens are particles in the air that cause your body to have an allergic reaction. This causes you to release allergic antibodies. Through a chain of events, these eventually cause you to release histamine into the blood stream. Although meant to protect the body, it is this release of histamine that causes your discomfort, such as frequent sneezing, congestion, and an itchy, runny nose.  CAUSES  Seasonal allergic rhinitis (hay fever) is caused by pollen allergens that may come from grasses, trees, and weeds. Year-round allergic rhinitis (perennial allergic rhinitis) is caused by allergens such as house dust mites, pet dander, and mold spores.  SYMPTOMS   Nasal stuffiness (congestion).  Itchy, runny nose with sneezing and tearing of the eyes. DIAGNOSIS  Your health care provider can help you determine the allergen or allergens that trigger your symptoms. If you and your health care provider are unable to determine the allergen, skin or blood testing may be used. TREATMENT  Allergic rhinitis does not have a cure, but it can be controlled by:  Medicines and allergy shots (immunotherapy).  Avoiding the allergen. Hay fever may often be treated with antihistamines in pill or nasal spray forms. Antihistamines block the effects of histamine. There are over-the-counter medicines that may help with nasal congestion and swelling around the eyes. Check with your health care provider before taking or giving this medicine.  If avoiding the allergen or the medicine prescribed do not work, there are many new medicines your health care provider can prescribe. Stronger medicine may be used if initial measures are ineffective. Desensitizing injections can be used if medicine and avoidance does not work. Desensitization is when a patient is given ongoing shots until the body becomes less sensitive  to the allergen. Make sure you follow up with your health care provider if problems continue. HOME CARE INSTRUCTIONS It is not possible to completely avoid allergens, but you can reduce your symptoms by taking steps to limit your exposure to them. It helps to know exactly what you are allergic to so that you can avoid your specific triggers. SEEK MEDICAL CARE IF:   You have a fever.  You develop a cough that does not stop easily (persistent).  You have shortness of breath.  You start wheezing.  Symptoms interfere with normal daily activities. Document Released: 04/21/2001 Document Revised: 08/01/2013 Document Reviewed: 04/03/2013 [facility] Patient Information 2015 [place], [place]. This information is not intended to replace advice given to you by your health care provider. Make sure you discuss any questions you have with your health care provider.

## 2014-01-29 NOTE — Progress Notes (Signed)
Subjective:    Patient ID: Audrey Jackson, female    DOB: 10/13/1975, 38 y.o.   MRN: [id number]  HPI 38 y.o. African American female presents with chief complaint of "I have a sinus infection". Pt states that she began having a runny nose with clear discharge, congestion and sinus pressure on Friday. She also states she had a temperature of 100. She denies taking any medication and states she stopped taking her allergy medications almost a year ago. Denies chills, fatigue, SOB and change in appetite.     Review of Systems  Constitutional: Positive for fever. Negative for chills, appetite change and fatigue.       Temperature of 100 at home  HENT: Positive for congestion, rhinorrhea and sinus pressure. Negative for ear pain and sore throat.   Eyes: Negative.   Respiratory: Negative.   Cardiovascular: Negative.   Gastrointestinal: Negative.   Endocrine: Negative.   Musculoskeletal: Negative.   Allergic/Immunologic: Positive for environmental allergies.  Neurological: Negative.   Hematological: Negative.   Psychiatric/Behavioral: Negative.    Past Medical History  Diagnosis Date  . Allergy   . Asthma   . Hx of migraines   . STD (sexually transmitted disease) 20 years ago    [place]  . History of migraine     History   Social History  . Marital Status: Divorced    Spouse Name: N/A    Number of Children: N/A  . Years of Education: N/A   Occupational History  . Not on file.   Social History Main Topics  . Smoking status: Never Smoker   . Smokeless tobacco: Never Used  . Alcohol Use: Yes  . Drug Use: No  . Sexual Activity: Yes    Birth Control/ Protection: Condom   Other Topics Concern  . Not on file   Social History Narrative  . No narrative on file    Past Surgical History  Procedure Laterality Date  . Breast reduction surgery  2003     H to DD  . Breast biopsy      x2  . 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 Alcohol abuse Mother   . Alcohol abuse Father   . Drug abuse Father   . Depression Father   . Bipolar disorder Father   . Diabetes Father   . Breast cancer Maternal Grandmother     Allergies  Allergen Reactions  . Banana Other (See Comments)    Shortness of Breath  . Kiwi Extract Other (See Comments)    SOB  . Soy Allergy Itching    Current Outpatient Prescriptions on File Prior to Visit  Medication Sig Dispense Refill  . beclomethasone (QVAR) 80 MCG/ACT inhaler Inhale 1 puff into the lungs as needed.      . montelukast (SINGULAIR) 10 MG tablet Take 10 mg by mouth at bedtime.       No current facility-administered medications on file prior to visit.    BP 100/80  Pulse 83  Temp(Src) 98.5 F (36.9 C) (Oral)  Wt 218 lb (98.884 kg)chart    Objective:   Physical Exam  Constitutional: She is oriented to person, place, and time. She appears well-developed and well-nourished. She is active.  HENT:  Head: Normocephalic.  Right Ear: Tympanic membrane, external ear and ear canal normal.  Left Ear: Tympanic membrane, external ear and ear canal normal.  Nose: Rhinorrhea present. Right  sinus exhibits no maxillary sinus tenderness and no frontal sinus tenderness. Left sinus exhibits no maxillary sinus tenderness and no frontal sinus tenderness.  Mouth/Throat: Uvula is midline, oropharynx is clear and moist and mucous membranes are normal.  Eyes: Conjunctivae and EOM are normal. Pupils are equal, round, and reactive to light.  Cardiovascular: Normal rate, regular rhythm, normal heart sounds and normal pulses.   Pulmonary/Chest: Effort normal and breath sounds normal.  Abdominal: Soft. Normal appearance and bowel sounds are normal.  Neurological: She is alert and oriented to person, place, and time.  Skin: Skin is warm, dry and intact.  Psychiatric: She has a normal mood and affect. Her speech is normal and behavior is normal.          Assessment & Plan:  Genevive  was seen today for no specified reason.  Diagnoses and associated orders for this visit:  Allergic rhinitis, unspecified allergic rhinitis type  Other Orders - fluticasone (FLONASE) 50 MCG/ACT nasal spray; Place 2 sprays into both nostrils daily.   Education: Start taking Zyrtec OTC Qday to control allergies. Notify provider for fever, worsening symptoms.

## 2014-02-08 ENCOUNTER — Encounter: Payer: Self-pay | Admitting: Certified Nurse Midwife

## 2014-02-08 ENCOUNTER — Ambulatory Visit (INDEPENDENT_AMBULATORY_CARE_PROVIDER_SITE_OTHER): Payer: BC Managed Care – PPO | Admitting: Certified Nurse Midwife

## 2014-02-08 VITALS — BP 104/64 | HR 68 | Temp 98.3°F | Resp 16 | Ht 66.5 in | Wt 215.0 lb

## 2014-02-08 DIAGNOSIS — N390 Urinary tract infection, site not specified: Secondary | ICD-10-CM

## 2014-02-08 LAB — POCT URINALYSIS DIPSTICK
BILIRUBIN UA: NEGATIVE
Glucose, UA: NEGATIVE
KETONES UA: NEGATIVE
Nitrite, UA: NEGATIVE
PH UA: 5
Protein, UA: NEGATIVE
Urobilinogen, UA: NEGATIVE

## 2014-02-08 MED ORDER — NITROFURANTOIN MONOHYD MACRO 100 MG PO CAPS: 100.0000 mg | ORAL_CAPSULE | Freq: Two times a day (BID) | ORAL | Status: DC

## 2014-02-08 NOTE — Patient Instructions (Signed)

## 2014-02-08 NOTE — Progress Notes (Signed)
S:  38 y.o.Divorced [place] American female presents with complaint of UTI. Symptoms began on 3-4 days ago. With symptoms of blood in urine, burning with urination, urinary frequency, urinary urgency.  Sexually active yes . Symptoms not related to post coital.  Current method of birth control *Mirena. Same partner without change. Denies fever, chills or fever or back ache. Denies vaginal symptoms. No new personal products. No other health issues.  ROS: fatigued  O alert, oriented to person, place, and time   healthy, well developed and well nourished  Skin: warm and dry  CVAT bilateral negative  Abdomen: positive suprapubic  Pelvic exam: External genitalia normal, no lesions  Bladder, urethral meatus red, tender, urethra tender  Vagina: normal vaginal discharge, not tender  Cervix: normal, IUD string noted  Uterus: normal, non tender  Adnexa: normal, non tender, no masses  Assessment: UTI POCT urine RBC 2+ WBC 2+     Plan:Discussed findings consistent with UTI Rx Macrobid see order. Lab: Urine micro/culture Discussed importance of increase water, decrease coffee,tea and soda. Warning signs give for UTI  RV prn

## 2014-02-09 LAB — URINALYSIS, MICROSCOPIC ONLY
Casts: NONE SEEN
Crystals: NONE SEEN

## 2014-02-11 LAB — URINE CULTURE: Colony Count: >=100000

## 2014-02-12 NOTE — Progress Notes (Signed)
Reviewed personally.  M. Suzanne Travez Stancil, MD.  

## 2014-04-30 ENCOUNTER — Telehealth: Payer: Self-pay | Admitting: Family

## 2014-04-30 ENCOUNTER — Ambulatory Visit (INDEPENDENT_AMBULATORY_CARE_PROVIDER_SITE_OTHER): Payer: BC Managed Care – PPO | Admitting: Physician Assistant

## 2014-04-30 ENCOUNTER — Encounter: Payer: Self-pay | Admitting: Physician Assistant

## 2014-04-30 ENCOUNTER — Ambulatory Visit (INDEPENDENT_AMBULATORY_CARE_PROVIDER_SITE_OTHER)
Admission: RE | Admit: 2014-04-30 | Discharge: 2014-04-30 | Disposition: A | Discharge status: Home / Self Care | Payer: BC Managed Care – PPO | Source: Ambulatory Visit | Attending: Physician Assistant | Admitting: Physician Assistant

## 2014-04-30 VITALS — BP 100/70 | HR 60 | Temp 98.6°F | Resp 18 | Wt 217.9 lb

## 2014-04-30 DIAGNOSIS — Z23 Encounter for immunization: Secondary | ICD-10-CM

## 2014-04-30 DIAGNOSIS — S9030XA Contusion of unspecified foot, initial encounter: Secondary | ICD-10-CM

## 2014-04-30 DIAGNOSIS — S90122A Contusion of left lesser toe(s) without damage to nail, initial encounter: Secondary | ICD-10-CM

## 2014-04-30 DIAGNOSIS — S8002XA Contusion of left knee, initial encounter: Secondary | ICD-10-CM

## 2014-04-30 DIAGNOSIS — S9032XA Contusion of left foot, initial encounter: Secondary | ICD-10-CM

## 2014-04-30 DIAGNOSIS — S8000XA Contusion of unspecified knee, initial encounter: Secondary | ICD-10-CM

## 2014-04-30 MED ORDER — MELOXICAM 15 MG PO TABS: 15.0000 mg | ORAL_TABLET | Freq: Every day | ORAL | Status: DC

## 2014-04-30 NOTE — Telephone Encounter (Signed)
Patient Information:  Caller Name: [place]  Phone: [phone]  Patient: Audrey Jackson, Audrey Jackson  Gender: Female  DOB: 23-Apr-1976  Age: 38 Years  PCP: Roxy [facility])  Pregnant: No  Office Follow Up:  Does the office need to follow up with this patient?: No  Instructions For The Office: N/A   Symptoms  Reason For Call & Symptoms: Audrey Jackson states she fell on 9/16 and hit her  knee. Pain in knee is increasing. Has used ice and Advil with no improvement. States knee is swollen. Severe pain to touch. Per knee injury protocol has see today in office disposition due to severe pain. Appt scheduled in EPIC for 15:45 with Kela Millin on 04/30/14.  Reviewed Health History In EMR: Yes  Reviewed Medications In EMR: Yes  Reviewed Allergies In EMR: Yes  Reviewed Surgeries / Procedures: Yes  Date of Onset of Symptoms: 04/25/2014  Treatments Tried: Ice, Advil  Treatments Tried Worked: No OB / GYN:  LMP: Unknown  Guideline(s) Used:  Knee Injury  Disposition Per Guideline:   See Today in Office  Reason For Disposition Reached:   Severe pain (e.g., excruciating)  Advice Given:  N/A  Patient Will Follow Care Advice:  YES  Appointment Scheduled:  04/30/2014 15:45:00 Appointment Scheduled Provider:  Kela Millin (only sees ages 59 and up)

## 2014-04-30 NOTE — Progress Notes (Signed)
Subjective:    Patient ID: Audrey Jackson, female    DOB: 1976/08/01, 38 y.o.   MRN: [id number]  HPI Patient is a 38 y.o. female presenting for knee pain after fall. Pt states that 1 week ago she slipped and fell on a wet floor while stepping out of her shower. She says she hit her left ankle and left knee hard on the floor. She states that her ankle is sore, however she states her knee pain is much worse. She states the knee pain is about 3/10 pain, dull ache while sitting still, however a more severe sharp pain with palpation. The pt has been using ace wrap, ice, elevation to try and help the pain, as well as advil. She states that this has helped some. Patient denies fevers, chills, nausea, vomiting, diarrhea, shortness of breath, chest pain, headache, syncope. She denies any numbness, tingling, weakness, or gait changes.    Review of Systems As per HPI and are otherwise negative.    Past Medical History  Diagnosis Date  . Allergy   . Asthma   . Hx of migraines   . STD (sexually transmitted disease) 20 years ago    [place]  . History of migraine     History   Social History  . Marital Status: Divorced    Spouse Name: N/A    Number of Children: N/A  . Years of Education: N/A   Occupational History  . Not on file.   Social History Main Topics  . Smoking status: Never Smoker   . Smokeless tobacco: Never Used  . Alcohol Use: Yes  . Drug Use: No  . Sexual Activity: Yes    Birth Control/ Protection: Condom   Other Topics Concern  . Not on file   Social History Narrative  . No narrative on file    Past Surgical History  Procedure Laterality Date  . Breast reduction surgery  2003     H to DD  . Breast biopsy      x2  . 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 Alcohol abuse Mother   . Alcohol abuse Father   . Drug abuse Father   . Depression Father   . Bipolar disorder Father   . Diabetes Father   .  Breast cancer Maternal Grandmother     Allergies  Allergen Reactions  . Banana Other (See Comments)    Shortness of Breath  . Kiwi Extract Other (See Comments)    SOB  . Soy Allergy Itching    Current Outpatient Prescriptions on File Prior to Visit  Medication Sig Dispense Refill  . fluticasone (FLONASE) 50 MCG/ACT nasal spray Place 2 sprays into both nostrils daily.  16 g  1   No current facility-administered medications on file prior to visit.    EXAM: BP 100/70  Pulse 60  Temp(Src) 98.6 F (37 C) (Oral)  Resp 18  Wt 217 lb 14.4 oz (98.839 kg)      Objective:   Physical Exam  Nursing note and vitals reviewed. Constitutional: She is oriented to person, place, and time. She appears well-developed and well-nourished. No distress.  HENT:  Head: Normocephalic and atraumatic.  Eyes: Conjunctivae and EOM are normal.  Neck: Normal range of motion.  Cardiovascular: Normal rate, regular rhythm and intact distal pulses.   Pulmonary/Chest: Effort normal and breath sounds normal. No respiratory distress. She exhibits no tenderness.  Musculoskeletal: Normal range of motion. She exhibits tenderness. She exhibits no edema.  Mild to moderate TTP to the anterior left knee. NROM of the left knee. Mild to moderate TTP of the dorsal left foot. NROM of left foot No ttp and NROM of the left ankle. Gait normal.  Neurological: She is alert and oriented to person, place, and time.  Skin: Skin is warm and dry. No rash noted. She is not diaphoretic. No pallor.  Visible contusion of the anterior left knee and dorsal left foot.  Psychiatric: She has a normal mood and affect. Her behavior is normal. Judgment and thought content normal.    Lab Results  Component Value Date   WBC 8.3 09/15/2013   HGB 12.8 09/15/2013   HCT 38.2 09/15/2013   PLT 306 09/15/2013   GLUCOSE 91 05/24/2012   NA 137 05/24/2012   K 3.9 05/24/2012   CL 102 05/24/2012   CREATININE 0.7 05/24/2012   BUN 8 05/24/2012   CO2  27 05/24/2012         Assessment & Plan:  Aemilia was seen today for knee pain.  Diagnoses and associated orders for this visit:  Need for prophylactic vaccination and inoculation against influenza - Flu Vaccine QUAD 36+ mos PF IM (Fluarix Quad PF)  Knee contusion, left, initial encounter Comments: Switch to mobic for pain and inflammation control, continue RICE therapy. Obtain knee and ankle films. - meloxicam (MOBIC) 15 MG tablet; Take 1 tablet (15 mg total) by mouth daily. - DG Knee Complete 4 Views Left; Future - DG Ankle Complete Left; Future - DG Foot Complete Left; Future  Contusion of foot including toes, left, initial encounter - meloxicam (MOBIC) 15 MG tablet; Take 1 tablet (15 mg total) by mouth daily. - DG Knee Complete 4 Views Left; Future - DG Ankle Complete Left; Future - DG Foot Complete Left; Future    Return precautions provided, and patient handout on Knee pain.  Plan to follow up as needed, or for worsening or persistent symptoms despite treatment.  Patient Instructions  We will call you with the results of your x-ray when they're available.  MOBIC one pill daily to help with pain and inflammation. You cannot take Advil, ibuprofen, Motrin, Aleve, naproxen with this.  If needed, you can take Tylenol with this.  Take with full glass of water.  Continue rice therapy.  If emergency symptoms discussed during visit developed, seek medical attention immediately.  Followup as needed, or for worsening or persistent symptoms despite treatment.

## 2014-04-30 NOTE — Progress Notes (Signed)
Pre visit review using our clinic review tool, if applicable. No additional management support is needed unless otherwise documented below in the visit note. 

## 2014-04-30 NOTE — Telephone Encounter (Signed)
Noted  

## 2014-04-30 NOTE — Patient Instructions (Addendum)
We will call you with the results of your x-ray when they're available.  MOBIC one pill daily to help with pain and inflammation. You cannot take Advil, ibuprofen, Motrin, Aleve, naproxen with this.  If needed, you can take Tylenol with this.  Take with full glass of water.  Continue rice therapy.  If emergency symptoms discussed during visit developed, seek medical attention immediately.  Followup as needed, or for worsening or persistent symptoms despite treatment.    Knee Pain Knee pain can be a result of an injury or other medical conditions. Treatment will depend on the cause of your pain. HOME CARE  Only take medicine as told by your doctor.  Keep a healthy weight. Being overweight can make the knee hurt more.  Stretch before exercising or playing sports.  If there is constant knee pain, change the way you exercise. Ask your doctor for advice.  Make sure shoes fit well. Choose the right shoe for the sport or activity.  Protect your knees. Wear kneepads if needed.  Rest when you are tired. GET HELP RIGHT AWAY IF:   Your knee pain does not stop.  Your knee pain does not get better.  Your knee joint feels hot to the touch.  You have a fever. MAKE SURE YOU:   Understand these instructions.  Will watch this condition.  Will get help right away if you are not doing well or get worse. Document Released: 10/23/2008 Document Revised: 10/19/2011 Document Reviewed: 10/23/2008 [facility] Patient Information 2015 [place], [place]. This information is not intended to replace advice given to you by your health care provider. Make sure you discuss any questions you have with your health care provider.

## 2014-06-11 ENCOUNTER — Encounter: Payer: Self-pay | Admitting: Physician Assistant

## 2015-07-16 ENCOUNTER — Telehealth: Payer: Self-pay | Admitting: Certified Nurse Midwife

## 2015-07-16 NOTE — Telephone Encounter (Signed)
Patient has had an IUD for about 2 years and now is having a cycle and cramping.

## 2015-07-16 NOTE — Telephone Encounter (Signed)
Message left to return call to [place] at [phone].  IUD placed by Dr. Charlies Constable 11/17/2013.  Last office visit 02/08/14.

## 2015-07-16 NOTE — Telephone Encounter (Signed)
Patient returned call. She states that she has been doing well with Mirena IUD but for the last two months, she has had increased pelvic pain and cramps like she felt "before I got the IUD." Patient has a prior history of fibroids and endometritis.  IUD placed 11/17/2013 by Dr. Charlies Constable here in office. Patient cannot feel IUD strings but has never been able to. Denies heavy bleeding, just ongoing spotting. Denies change in partner or STD concerns.  Used Aleve today for cramps with no relief.   Scheduled office visit with Dr. Quincy Simmonds for 07/17/15 at 0815. Patient agreeable. Will call back or seek care at local Emergency Department with any increase in pain or heavy bleeding. Routing to provider for final review. Patient agreeable to disposition. Will close encounter.

## 2015-07-17 ENCOUNTER — Encounter: Payer: Self-pay | Admitting: Obstetrics and Gynecology

## 2015-07-17 ENCOUNTER — Ambulatory Visit (INDEPENDENT_AMBULATORY_CARE_PROVIDER_SITE_OTHER): Payer: BC Managed Care – PPO | Admitting: Obstetrics and Gynecology

## 2015-07-17 VITALS — BP 116/72 | HR 82 | Resp 18 | Ht 66.0 in | Wt 220.2 lb

## 2015-07-17 DIAGNOSIS — N946 Dysmenorrhea, unspecified: Secondary | ICD-10-CM | POA: Diagnosis not present

## 2015-07-17 DIAGNOSIS — Z113 Encounter for screening for infections with a predominantly sexual mode of transmission: Secondary | ICD-10-CM | POA: Diagnosis not present

## 2015-07-17 DIAGNOSIS — M25559 Pain in unspecified hip: Secondary | ICD-10-CM

## 2015-07-17 LAB — POCT URINE PREGNANCY: Preg Test, Ur: NEGATIVE

## 2015-07-17 NOTE — Progress Notes (Signed)
GYNECOLOGY  VISIT   HPI: 39 y.o.   Divorced  [place] American  female   [phone] with Patient's last menstrual period was 07/14/2015.   here for  Pelvic pain. Cycles returned for the last 2 months.  Pain is occurring just when she is having her menstrual bleeding, which is light.  No other bleeding.  No pelvic pressure or pain with intercourse.   No charge of partners.   Last pelvic ultrasound 09/12/13 - 4 small fibroids, largest 1.8 cm.  Had left CL cyst 1.7 cm.   Mirena IUD placed on 11/17/13. Placed for dysmenorrhea and pelvic pain.   Remote hx of chlamydia.   Had postpartum endometritis.   Hx of migraine HA with aura.   GYNECOLOGIC HISTORY: Patient's last menstrual period was 07/14/2015. Contraception:None Menopausal hormone therapy: none Last mammogram: None          OB History    Gravida Para Term Preterm AB TAB SAB Ectopic Multiple Living   2 2 2       2          Patient Active Problem List   Diagnosis Date Noted  . Fibroid uterus 09/15/2013  . History of bilateral breast reduction surgery 06/16/2013  . Migraine with aura 06/16/2013    Past Medical History  Diagnosis Date  . Allergy   . Asthma   . Hx of migraines   . STD (sexually transmitted disease) 20 years ago    [place]  . History of migraine     Past Surgical History  Procedure Laterality Date  . Breast reduction surgery  2003     H to DD  . Breast biopsy      x2  . 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 fluticasone (FLONASE) 50 MCG/ACT nasal spray Place 2 sprays into both nostrils daily. (Patient not taking: Reported on 07/17/2015) 16 g 1   No current facility-administered medications for this visit.     ALLERGIES: Banana; Kiwi extract; and Soy allergy  Family History  Problem Relation Age of Onset  . Alcohol abuse Mother   . Alcohol abuse Father   . Drug abuse Father   . Depression Father   . Bipolar disorder  Father   . Diabetes Father   . Breast cancer Maternal Grandmother     Social History   Social History  . Marital Status: Divorced    Spouse Name: N/A  . Number of Children: N/A  . Years of Education: N/A   Occupational History  . Not on file.   Social History Main Topics  . Smoking status: Never Smoker   . Smokeless tobacco: Never Used  . Alcohol Use: Yes  . Drug Use: No  . Sexual Activity: Yes    Birth Control/ Protection: Condom, IUD   Other Topics Concern  . Not on file   Social History Narrative    ROS:  Pertinent items are noted in HPI.  PHYSICAL EXAMINATION:    BP 116/72 mmHg  Pulse 82  Resp 18  Ht 5\' 6"  (1.676 m)  Wt 220 lb 3.2 oz (99.882 kg)  BMI 35.56 kg/m2  LMP 07/14/2015    General appearance: alert, cooperative and appears stated age   Abdomen: soft, mild tenderness in right lower quadrant, no guarding or rebound; no masses,  no organomegaly   Pelvic: External genitalia:  no lesions  Urethra:  normal appearing urethra with no masses, tenderness or lesions              Bartholins and Skenes: normal                 Vagina: normal appearing vagina with normal color and discharge, no lesions              Cervix:  tannish discharge.  IUD strings seen.  No CMT. Bimanual Exam:  Uterus:  normal size, contour, position, consistency, mobility, non-tender              Adnexa: no mass, fullness, tenderness and on left. Right adnexa with mild tenderness and no mass.           Chaperone was present for exam.  ASSESSMENT  Pelvic pain and dysmenorrhea.  No evidence of PID. Mirena IUD patient.   PLAN  Counseled regarding potential etiologies for pelvic pain and bleeding - ovulation, ovarian cysts in general, fibroids, endometriosis, infection, malpositioned IUD. STD testing.  UPT now - negative. Return for pelvic ultrasound.    An After Visit Summary was printed and given to the patient.  __25____ minutes face to face time of which over  50% was spent in counseling.

## 2015-07-18 LAB — HEPATITIS C ANTIBODY: HCV AB: NEGATIVE

## 2015-07-18 LAB — STD PANEL
HIV 1&2 Ab, 4th Generation: NONREACTIVE
Hepatitis B Surface Ag: NEGATIVE

## 2015-07-18 LAB — GC/CHLAMYDIA PROBE AMP, URINE
Chlamydia, Swab/Urine, PCR: NOT DETECTED
GC PROBE AMP, URINE: NOT DETECTED

## 2015-07-25 ENCOUNTER — Telehealth: Payer: Self-pay | Admitting: Obstetrics and Gynecology

## 2015-07-25 NOTE — Telephone Encounter (Signed)
Spoke with patient. Reviewed benefit for pelvic ultrasound. Patient understood, but has concerns and not ready to schedule at this time. Patient prefers to call back to schedule. Routing to clinical to review.

## 2015-07-31 ENCOUNTER — Ambulatory Visit (INDEPENDENT_AMBULATORY_CARE_PROVIDER_SITE_OTHER): Payer: BC Managed Care – PPO | Admitting: Certified Nurse Midwife

## 2015-07-31 ENCOUNTER — Encounter: Payer: Self-pay | Admitting: Certified Nurse Midwife

## 2015-07-31 VITALS — BP 106/70 | HR 70 | Resp 16 | Ht 66.25 in | Wt 221.0 lb

## 2015-07-31 DIAGNOSIS — N898 Other specified noninflammatory disorders of vagina: Secondary | ICD-10-CM

## 2015-07-31 DIAGNOSIS — Z0000 Encounter for general adult medical examination without abnormal findings: Secondary | ICD-10-CM | POA: Diagnosis not present

## 2015-07-31 DIAGNOSIS — N9489 Other specified conditions associated with female genital organs and menstrual cycle: Secondary | ICD-10-CM | POA: Diagnosis not present

## 2015-07-31 DIAGNOSIS — Z124 Encounter for screening for malignant neoplasm of cervix: Secondary | ICD-10-CM | POA: Diagnosis not present

## 2015-07-31 DIAGNOSIS — Z01419 Encounter for gynecological examination (general) (routine) without abnormal findings: Secondary | ICD-10-CM

## 2015-07-31 LAB — POCT URINALYSIS DIPSTICK
Bilirubin, UA: NEGATIVE
Blood, UA: NEGATIVE
Glucose, UA: NEGATIVE
KETONES UA: NEGATIVE
LEUKOCYTES UA: NEGATIVE
Nitrite, UA: NEGATIVE
PH UA: 5
PROTEIN UA: NEGATIVE
UROBILINOGEN UA: NEGATIVE

## 2015-07-31 LAB — COMPREHENSIVE METABOLIC PANEL
ALT: 13 U/L (ref 6–29)
AST: 14 U/L (ref 10–30)
Albumin: 4.1 g/dL (ref 3.6–5.1)
Alkaline Phosphatase: 59 U/L (ref 33–115)
BILIRUBIN TOTAL: 0.3 mg/dL (ref 0.2–1.2)
BUN: 11 mg/dL (ref 7–25)
CALCIUM: 9.1 mg/dL (ref 8.6–10.2)
CO2: 23 mmol/L (ref 20–31)
Chloride: 104 mmol/L (ref 98–110)
Creat: 0.8 mg/dL (ref 0.50–1.10)
GLUCOSE: 83 mg/dL (ref 65–99)
Potassium: 4.1 mmol/L (ref 3.5–5.3)
Sodium: 136 mmol/L (ref 135–146)
Total Protein: 7.5 g/dL (ref 6.1–8.1)

## 2015-07-31 LAB — LIPID PANEL
Cholesterol: 180 mg/dL (ref 125–200)
HDL: 36 mg/dL — AB (ref >=46)
LDL CALC: 118 mg/dL (ref <130)
Total CHOL/HDL Ratio: 5 Ratio (ref <=5.0)
Triglycerides: 131 mg/dL (ref <150)
VLDL: 26 mg/dL (ref <30)

## 2015-07-31 LAB — TSH: TSH: 1.644 u[IU]/mL (ref 0.350–4.500)

## 2015-07-31 LAB — VITAMIN D 25 HYDROXY (VIT D DEFICIENCY, FRACTURES): VIT D 25 HYDROXY: 12 ng/mL — AB (ref 30–100)

## 2015-07-31 LAB — HEMOGLOBIN, FINGERSTICK: HEMOGLOBIN, FINGERSTICK: 13.7 g/dL (ref 12.0–16.0)

## 2015-07-31 NOTE — Progress Notes (Signed)
39 y.o. [id number] Divorced  Caucasian Fe here for annual exam.  Periods just spotting with Mirena IUD. Was in last week for cramping that had changed. Seen her by Dr. Quincy Simmonds( see note). Cramping reminded her of previous periods prior to IUD. All resolved now, unsure if she wants another PUS. Cramping only occurring with period day 1-3. Sees PCP if needed Desires screening labs today. STD screening completed one week ago negative. No other health concerns today.  Patient's last menstrual period was 07/15/2015.          Sexually active: Yes.    The current method of family planning is IUD. 11/27/13.  Exercising: No.  exercise Smoker:  no  Health Maintenance: Pap: 05-03-12 neg HPV HR neg MMG:  2013 birads 1:neg Colonoscopy:  none BMD:   none TDaP:  2010 Shingles: no Pneumonia: no Hep C and HIV: neg per patient Labs: poct urine-neg, hgb-13.7 Self breast exam: done monthly   reports that she has never smoked. She has never used smokeless tobacco. She reports that she drinks about 0.6 - 1.2 oz of alcohol per week. She reports that she does not use illicit drugs.  Past Medical History  Diagnosis Date  . Allergy   . Asthma   . Hx of migraines   . STD (sexually transmitted disease) 20 years ago    [place]  . History of migraine     Past Surgical History  Procedure Laterality Date  . Breast reduction surgery  2003     H to DD  . Breast biopsy      x2  . 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   No current facility-administered medications for this visit.    Family History  Problem Relation Age of Onset  . Alcohol abuse Mother   . Alcohol abuse Father   . Drug abuse Father   . Depression Father   . Bipolar disorder Father   . Diabetes Father   . Breast cancer Maternal Grandmother     ROS:  Pertinent items are noted in HPI.  Otherwise, a comprehensive ROS was negative.  Exam:   Ht 5' 6.25" (1.683 m)  Wt 221 lb (100.245 kg)   BMI 35.39 kg/m2  LMP 07/15/2015 Height: 5' 6.25" (168.3 cm) Ht Readings from Last 3 Encounters:  07/31/15 5' 6.25" (1.683 m)  07/17/15 5\' 6"  (1.676 m)  02/08/14 5' 6.5" (1.689 m)    General appearance: alert, cooperative and appears stated age Head: Normocephalic, without obvious abnormality, atraumatic Neck: no adenopathy, supple, symmetrical, trachea midline and thyroid normal to inspection and palpation Lungs: clear to auscultation bilaterally Breasts: normal appearance, no masses or tenderness, No nipple retraction or dimpling, No nipple discharge or bleeding, No axillary or supraclavicular adenopathy, scarring from reduction bilateral Heart: regular rate and rhythm Abdomen: soft, non-tender; no masses,  no organomegaly Extremities: extremities normal, atraumatic, no cyanosis or edema Skin: Skin color, texture, turgor normal. No rashes or lesions Lymph nodes: Cervical, supraclavicular, and axillary nodes normal. No abnormal inguinal nodes palpated Neurologic: Grossly normal   Pelvic: External genitalia:  no lesions              Urethra:  normal appearing urethra with no masses, tenderness or lesions              Bartholin's and Skene's: normal                 Vagina:  normal appearing vagina with normal color and sour odor noted, normal discharge, no lesions, affirm taken              Cervix: normal,no lesions or tenderness, IUD string appropriate length              Pap taken: Yes.   Bimanual Exam:  Uterus:  normal size, contour, position, consistency, mobility, non-tender              Adnexa: normal adnexa and no mass, fullness, tenderness               Rectovaginal: Confirms               Anus:  normal sphincter tone, no lesions  Chaperone present: yes  A:  Well Woman with normal exam  Contraception Mirena IUD due for removal 4/20  Vaginal odor  History of dysmenorrhea  Screening labs  P:   Reviewed health and wellness pertinent to exam  Has warning signs of IUD and  will advise if occurs  Affirm taken will treat if indicated  Discussed OTC medication use for and heating bad. Warnings with given  Lab: Lipid panel, CMP, Vitamin D, TSH  Pap smear as above with HPVHR    counseled on breast self exam, STD prevention, HIV risk factors and prevention, adequate intake of calcium and vitamin D, diet and exercise  return annually or prn  An After Visit Summary was printed and given to the patient.

## 2015-07-31 NOTE — Patient Instructions (Signed)
EXERCISE AND DIET:  We recommended that you start or continue a regular exercise program for good health. Regular exercise means any activity that makes your heart beat faster and makes you sweat.  We recommend exercising at least 30 minutes per day at least 3 days a week, preferably 4 or 5.  We also recommend a diet low in fat and sugar.  Inactivity, poor dietary choices and obesity can cause diabetes, heart attack, stroke, and kidney damage, among others.    ALCOHOL AND SMOKING:  Women should limit their alcohol intake to no more than 7 drinks/beers/glasses of wine (combined, not each!) per week. Moderation of alcohol intake to this level decreases your risk of breast cancer and liver damage. And of course, no recreational drugs are part of a healthy lifestyle.  And absolutely no smoking or even second hand smoke. Most people know smoking can cause heart and lung diseases, but did you know it also contributes to weakening of your bones? Aging of your skin?  Yellowing of your teeth and nails?  CALCIUM AND VITAMIN D:  Adequate intake of calcium and Vitamin D are recommended.  The recommendations for exact amounts of these supplements seem to change often, but generally speaking 600 mg of calcium (either carbonate or citrate) and 800 units of Vitamin D per day seems prudent. Certain women may benefit from higher intake of Vitamin D.  If you are among these women, your doctor will have told you during your visit.    PAP SMEARS:  Pap smears, to check for cervical cancer or precancers,  have traditionally been done yearly, although recent scientific advances have shown that most women can have pap smears less often.  However, every woman still should have a physical exam from her gynecologist every year. It will include a breast check, inspection of the vulva and vagina to check for abnormal growths or skin changes, a visual exam of the cervix, and then an exam to evaluate the size and shape of the uterus and  ovaries.  And after 38 years of age, a rectal exam is indicated to check for rectal cancers. We will also provide age appropriate advice regarding health maintenance, like when you should have certain vaccines, screening for sexually transmitted diseases, bone density testing, colonoscopy, mammograms, etc.   MAMMOGRAMS:  All women over 40 years old should have a yearly mammogram. Many facilities now offer a "3D" mammogram, which may cost around $50 extra out of pocket. If possible,  we recommend you accept the option to have the 3D mammogram performed.  It both reduces the number of women who will be called back for extra views which then turn out to be normal, and it is better than the routine mammogram at detecting truly abnormal areas.    COLONOSCOPY:  Colonoscopy to screen for colon cancer is recommended for all women at age 50.  We know, you hate the idea of the prep.  We agree, BUT, having colon cancer and not knowing it is worse!!  Colon cancer so often starts as a polyp that can be seen and removed at colonscopy, which can quite literally save your life!  And if your first colonoscopy is normal and you have no family history of colon cancer, most women don't have to have it again for 10 years.  Once every ten years, you can do something that may end up saving your life, right?  We will be happy to help you get it scheduled when you are ready.    Be sure to check your insurance coverage so you understand how much it will cost.  It may be covered as a preventative service at no cost, but you should check your particular policy.     Dysmenorrhea Dysmenorrhea is pain during a menstrual period. You will have pain in the lower belly (abdomen). The pain is caused by the tightening (contracting) of the muscles of the uterus. The pain can be minor or severe. Headache, feeling sick to your stomach (nausea), throwing up (vomiting), or low back pain may occur with this condition. HOME CARE  Only take medicine  as told by your doctor.  Place a heating pad or hot water bottle on your lower back or belly. Do not sleep with a heating pad.  Exercise may help lessen the pain.  Massage the lower back or belly.  Stop smoking.  Avoid alcohol and caffeine. GET HELP IF:   Your pain does not get better with medicine.  You have pain during sex.  Your pain gets worse while taking pain medicine.  Your period bleeding is heavier than normal.  You keep feeling sick to your stomach or keep throwing up. GET HELP RIGHT AWAY IF: You pass out (faint).   This information is not intended to replace advice given to you by your health care provider. Make sure you discuss any questions you have with your health care provider.   Document Released: 10/23/2008 Document Revised: 08/01/2013 Document Reviewed: 01/12/2013 Elsevier Interactive Patient Education 2016 [employer]. Levonorgestrel intrauterine device (IUD) What is this medicine? LEVONORGESTREL IUD (LEE voe nor jes trel) is a contraceptive (birth control) device. The device is placed inside the uterus by a healthcare professional. It is used to prevent pregnancy and can also be used to treat heavy bleeding that occurs during your period. Depending on the device, it can be used for 3 to 5 years. This medicine may be used for other purposes; ask your health care provider or pharmacist if you have questions. What should I tell my health care provider before I take this medicine? They need to know if you have any of these conditions: -abnormal Pap smear -cancer of the breast, uterus, or cervix -diabetes -endometritis -genital or pelvic infection now or in the past -have more than one sexual partner or your partner has more than one partner -heart disease -history of an ectopic or tubal pregnancy -immune system problems -IUD in place -liver disease or tumor -problems with blood clots or take blood-thinners -use intravenous drugs -uterus of unusual  shape -vaginal bleeding that has not been explained -an unusual or allergic reaction to levonorgestrel, other hormones, silicone, or polyethylene, medicines, foods, dyes, or preservatives -pregnant or trying to get pregnant -breast-feeding How should I use this medicine? This device is placed inside the uterus by a health care professional. Talk to your pediatrician regarding the use of this medicine in children. Special care may be needed. Overdosage: If you think you have taken too much of this medicine contact a poison control center or emergency room at once. NOTE: This medicine is only for you. Do not share this medicine with others. What if I miss a dose? This does not apply. What may interact with this medicine? Do not take this medicine with any of the following medications: -amprenavir -bosentan -fosamprenavir This medicine may also interact with the following medications: -aprepitant -barbiturate medicines for inducing sleep or treating seizures -bexarotene -griseofulvin -medicines to treat seizures like carbamazepine, ethotoin, felbamate, oxcarbazepine, phenytoin, topiramate -modafinil -pioglitazone -rifabutin -  rifampin -rifapentine -some medicines to treat HIV infection like atazanavir, indinavir, lopinavir, nelfinavir, tipranavir, ritonavir -St. John's wort -warfarin This list may not describe all possible interactions. Give your health care provider a list of all the medicines, herbs, non-prescription drugs, or dietary supplements you use. Also tell them if you smoke, drink alcohol, or use illegal drugs. Some items may interact with your medicine. What should I watch for while using this medicine? Visit your doctor or health care professional for regular check ups. See your doctor if you or your partner has sexual contact with others, becomes HIV positive, or gets a sexual transmitted disease. This product does not protect you against HIV infection (AIDS) or other  sexually transmitted diseases. You can check the placement of the IUD yourself by reaching up to the top of your vagina with clean fingers to feel the threads. Do not pull on the threads. It is a good habit to check placement after each menstrual period. Call your doctor right away if you feel more of the IUD than just the threads or if you cannot feel the threads at all. The IUD may come out by itself. You may become pregnant if the device comes out. If you notice that the IUD has come out use a backup birth control method like condoms and call your health care provider. Using tampons will not change the position of the IUD and are okay to use during your period. What side effects may I notice from receiving this medicine? Side effects that you should report to your doctor or health care professional as soon as possible: -allergic reactions like skin rash, itching or hives, swelling of the face, lips, or tongue -fever, flu-like symptoms -genital sores -high blood pressure -no menstrual period for 6 weeks during use -pain, swelling, warmth in the leg -pelvic pain or tenderness -severe or sudden headache -signs of pregnancy -stomach cramping -sudden shortness of breath -trouble with balance, talking, or walking -unusual vaginal bleeding, discharge -yellowing of the eyes or skin Side effects that usually do not require medical attention (report to your doctor or health care professional if they continue or are bothersome): -acne -breast pain -change in sex drive or performance -changes in weight -cramping, dizziness, or faintness while the device is being inserted -headache -irregular menstrual bleeding within first 3 to 6 months of use -nausea This list may not describe all possible side effects. Call your doctor for medical advice about side effects. You may report side effects to FDA at 1-800-FDA-1088. Where should I keep my medicine? This does not apply. NOTE: This sheet is a  summary. It may not cover all possible information. If you have questions about this medicine, talk to your doctor, pharmacist, or health care provider.    2016, Elsevier/Gold Standard. (2011-08-27 13:54:04)

## 2015-08-01 ENCOUNTER — Other Ambulatory Visit: Payer: Self-pay

## 2015-08-01 ENCOUNTER — Other Ambulatory Visit: Payer: Self-pay | Admitting: Certified Nurse Midwife

## 2015-08-01 DIAGNOSIS — E559 Vitamin D deficiency, unspecified: Secondary | ICD-10-CM

## 2015-08-01 DIAGNOSIS — B9689 Other specified bacterial agents as the cause of diseases classified elsewhere: Secondary | ICD-10-CM

## 2015-08-01 DIAGNOSIS — N760 Acute vaginitis: Principal | ICD-10-CM

## 2015-08-01 LAB — WET PREP BY MOLECULAR PROBE
Candida species: NEGATIVE
Gardnerella vaginalis: POSITIVE — AB
TRICHOMONAS VAG: NEGATIVE

## 2015-08-01 MED ORDER — METRONIDAZOLE 0.75 % VA GEL: 1.0000 | Freq: Every day | VAGINAL | Status: DC

## 2015-08-02 LAB — IPS PAP TEST WITH HPV

## 2015-08-02 NOTE — Progress Notes (Signed)
Reviewed personally.  M. Suzanne Ted Leonhart, MD.  

## 2015-08-27 NOTE — Telephone Encounter (Signed)
Dr. Quincy Simmonds,  Can you review? Patient was seen by you 07/17/15 and ordered for Pelvic ultrasound. Patient was given benefit information and subsequently declined to schedule Pelvic ultrasound .  Patient was seen by Melvia Heaps CNM on 07/31/15 for annual exam and stated she was considering not pursuing ultrasound.   Okay to cancel order?

## 2015-08-27 NOTE — Telephone Encounter (Signed)
Chart reviewed. Ok to cancel order for pelvic ultrasound and close encounter.  Cc- Lerry Liner

## 2015-08-28 NOTE — Telephone Encounter (Signed)
Order DC. Encounter closed.

## 2015-10-30 ENCOUNTER — Telehealth: Payer: Self-pay | Admitting: Certified Nurse Midwife

## 2015-10-30 ENCOUNTER — Other Ambulatory Visit: Payer: BC Managed Care – PPO

## 2015-10-30 NOTE — Telephone Encounter (Signed)
Left message on voicemail to call and reschedule missed lab appointment. °

## 2016-05-18 ENCOUNTER — Ambulatory Visit: Payer: BC Managed Care – PPO | Admitting: Nurse Practitioner

## 2016-05-18 ENCOUNTER — Encounter: Payer: Self-pay | Admitting: Nurse Practitioner

## 2016-05-18 VITALS — BP 108/66 | HR 68 | Temp 97.9°F | Ht 66.25 in | Wt 216.0 lb

## 2016-05-18 DIAGNOSIS — R300 Dysuria: Secondary | ICD-10-CM

## 2016-05-18 DIAGNOSIS — R3915 Urgency of urination: Secondary | ICD-10-CM

## 2016-05-18 DIAGNOSIS — R350 Frequency of micturition: Secondary | ICD-10-CM | POA: Diagnosis not present

## 2016-05-18 LAB — POCT URINALYSIS DIPSTICK
Bilirubin, UA: NEGATIVE
Glucose, UA: NEGATIVE
Ketones, UA: NEGATIVE
Nitrite, UA: NEGATIVE
PROTEIN UA: NEGATIVE
UROBILINOGEN UA: NEGATIVE
pH, UA: 5

## 2016-05-18 MED ORDER — NITROFURANTOIN MONOHYD MACRO 100 MG PO CAPS: 100.0000 mg | ORAL_CAPSULE | Freq: Two times a day (BID) | ORAL | 0 refills | Status: DC

## 2016-05-18 MED ORDER — FLUCONAZOLE 150 MG PO TABS: 150.0000 mg | ORAL_TABLET | Freq: Once | ORAL | 0 refills | Status: AC

## 2016-05-18 NOTE — Progress Notes (Signed)
40 y.o.Divorced [place] American female [phone] here with complaint of UTI, with onset  on Saturday.   Patient complaining of:  dysuria, hematuria, urinary frequency, urinary urgency, abdominal pain and cloudy malordorous urine. Patient denies fever, chills, nausea or back pain. No new personal products. Patient feels might be related to sexual activity and last SA about 10 days ago.  Denies vaginal symptoms.    Contraception is Mirena IUD with very little spotting to no cycle.  New change in partner X 2 month.  Patient has inadequate water intake.     O: Healthy female WDWN Affect: Normal, orientation x 3 Skin : warm and dry CVAT: negative  bilateral Abdomen: negative for suprapubic tenderness  Pelvic exam: declines STD testing and will get at [facility] with Ms. Debbie  POCT:  + leuk's, trace of RBC and cloudy  A:  R/O UTI  Declines STD's  Has Mirena IUD since 11/27/13  P: Reviewed findings of UTI and need for treatment. Rx:  Macrobid 100 mg BID # [id number] micro, culture Reviewed warning signs and symptoms of UTI and need to advise if occurring. Encouraged to limit soda, tea, and coffee   RV prn

## 2016-05-18 NOTE — Patient Instructions (Signed)

## 2016-05-19 LAB — URINALYSIS, MICROSCOPIC ONLY
Bacteria, UA: NONE SEEN [HPF]
Casts: NONE SEEN [LPF]
Squamous Epithelial / LPF: NONE SEEN [HPF] (ref <=5)
WBC UA: NONE SEEN WBC/HPF (ref <=5)
Yeast: NONE SEEN [HPF]

## 2016-05-19 NOTE — Progress Notes (Signed)
Encounter reviewed by Dr. Brook Amundson C. Silva.  

## 2016-05-20 LAB — URINE CULTURE

## 2016-05-22 ENCOUNTER — Telehealth: Payer: Self-pay | Admitting: *Deleted

## 2016-05-22 NOTE — Telephone Encounter (Signed)
Patient has not reviewed labs via [place].  I have attempted to contact this patient by phone with the following results: no answer, automated message stating cellular customer not available at this time. [phone] (Mobile) *Preferred*

## 2016-05-22 NOTE — Telephone Encounter (Signed)
-----   Message from [name], [place] sent at 05/20/2016 12:36 PM EDT ----- Results via my chart:  Audrey Jackson,  The urine culture was positive for infection and you are on the correct antibiotics.

## 2016-05-26 NOTE — Telephone Encounter (Signed)
Patient reviewed labs and message via MyChart.    Ending follow up.  Closing encounter.

## 2016-07-20 ENCOUNTER — Telehealth: Payer: Self-pay | Admitting: Certified Nurse Midwife

## 2016-07-20 ENCOUNTER — Ambulatory Visit (INDEPENDENT_AMBULATORY_CARE_PROVIDER_SITE_OTHER): Payer: BC Managed Care – PPO | Admitting: Nurse Practitioner

## 2016-07-20 ENCOUNTER — Encounter: Payer: Self-pay | Admitting: Nurse Practitioner

## 2016-07-20 VITALS — BP 116/68 | HR 64 | Ht 66.25 in | Wt 213.0 lb

## 2016-07-20 DIAGNOSIS — N760 Acute vaginitis: Secondary | ICD-10-CM | POA: Diagnosis not present

## 2016-07-20 DIAGNOSIS — N390 Urinary tract infection, site not specified: Secondary | ICD-10-CM | POA: Diagnosis not present

## 2016-07-20 MED ORDER — NYSTATIN-TRIAMCINOLONE 100000-0.1 UNIT/GM-% EX OINT: 1.0000 "application " | TOPICAL_OINTMENT | Freq: Two times a day (BID) | CUTANEOUS | 0 refills | Status: DC

## 2016-07-20 NOTE — Progress Notes (Signed)
40 y.o. Divorced [place] American female [phone] here with complaint of vaginal symptoms of itching, burning, and increase discharge. Describes discharge as white and thick at times.  Onset of symptoms 4 days ago. Denies new personal products or vaginal dryness.She has STD concerns with new partner for a month. Urinary symptoms some better since . Contraception is Mirena IUD.   O:  Healthy female WDWN Affect: normal, orientation x 3  Exam: no distress Abdomen:  soft and non tender Lymph node: no enlargement or tenderness Pelvic exam: External genital: normal female with a lot of erythema, no lesions BUS: negative Vagina:thick white discharge noted.  Affirm taken. Cervix: normal, non tender, no CMT, IUD is not seen due to discharge Uterus: normal, non tender Adnexa:normal, non tender, no masses or fullness noted    A: R/O Vaginitis  External vulvovaginitis  R/O STD's  R/O UTI - finished Macrobid a week ago - TOC  Mirena IUD 11/27/13   P: Discussed findings of vaginitis and etiology. Discussed Aveeno or baking soda sitz bath for comfort. Avoid moist clothes or pads for extended period of time. If working out in gym clothes or swim suits for long periods of time change underwear or bottoms of swimsuit if possible. Olive Oil/Coconut Oil use for skin protection prior to activity can be used to external skin.  Rx: Mycolog cream use BID  Will follow pending test results  Follow with Affirm  RV prn

## 2016-07-20 NOTE — Telephone Encounter (Signed)
Spoke with patient. Patient states she has been experiencing vaginal irritation and itching without discharge since 07/18/16. Patient states she took diflucan x1 on 12/9 with no relief, would like to know what to do next. Recommended Ov for further evaluation -patient scheduled for 07/20/16 at 12:45pm with [name], NP. Patient is agreeable to date and time.  Routing to provider for final review. Patient is agreeable to disposition. Will close encounter.

## 2016-07-20 NOTE — Patient Instructions (Signed)
Sexually Transmitted Disease A sexually transmitted disease (STD) is a disease or infection often passed to another person during sex. However, STDs can be passed through nonsexual ways. An STD can be passed through:  Spit (saliva).  Semen.  Blood.  Mucus from the vagina.  Pee (urine). How can I lessen my chances of getting an STD?  Use:  Latex condoms.  Water-soluble lubricants with condoms. Do not use petroleum jelly or oils.  Dental dams. These are small pieces of latex that are used as a barrier during oral sex.  Avoid having more than one sex partner.  Do not have sex with someone who has other sex partners.  Do not have sex with anyone you do not know or who is at high risk for an STD.  Avoid risky sex that can break your skin.  Do not have sex if you have open sores on your mouth or skin.  Avoid drinking too much alcohol or taking illegal drugs. Alcohol and drugs can affect your good judgment.  Avoid oral and anal sex acts.  Get shots (vaccines) for HPV and hepatitis.  If you are at risk of being infected with HIV, it is advised that you take a certain medicine daily to prevent HIV infection. This is called pre-exposure prophylaxis (PrEP). You may be at risk if:  You are a man who has sex with other men (MSM).  You are attracted to the opposite sex (heterosexual) and are having sex with more than one partner.  You take drugs with a needle.  You have sex with someone who has HIV.  Talk with your doctor about if you are at high risk of being infected with HIV. If you begin to take PrEP, get tested for HIV first. Get tested every 3 months for as long as you are taking PrEP.  Get tested for STDs every year if you are sexually active. If you are treated for an STD, get tested again 3 months after you are treated. What should I do if I think I have an STD?  See your doctor.  Tell your sex partner(s) that you have an STD. They should be tested and treated.  Do  not have sex until your doctor says it is okay. When should I get help? Get help right away if:  You have bad belly (abdominal) pain.  You are a man and have puffiness (swelling) or pain in your testicles.  You are a woman and have puffiness in your vagina. This information is not intended to replace advice given to you by your health care provider. Make sure you discuss any questions you have with your health care provider. Document Released: 09/03/2004 Document Revised: 01/02/2016 Document Reviewed: 01/20/2013 Elsevier Interactive Patient Education  2017 Elsevier Inc.  

## 2016-07-20 NOTE — Telephone Encounter (Signed)
Patient says she thinks she may have a yeast infection.  She had some diflucan and tried that but it did not help.

## 2016-07-21 ENCOUNTER — Other Ambulatory Visit: Payer: Self-pay | Admitting: Nurse Practitioner

## 2016-07-21 LAB — URINE CULTURE

## 2016-07-21 LAB — WET PREP BY MOLECULAR PROBE
Candida species: POSITIVE — AB
Gardnerella vaginalis: POSITIVE — AB
Trichomonas vaginosis: NEGATIVE

## 2016-07-21 LAB — GC/CHLAMYDIA PROBE AMP
CT Probe RNA: NOT DETECTED
GC Probe RNA: NOT DETECTED

## 2016-07-21 LAB — STD PANEL
HIV: NONREACTIVE
Hepatitis B Surface Ag: NEGATIVE

## 2016-07-21 MED ORDER — FLUCONAZOLE 150 MG PO TABS: 150.0000 mg | ORAL_TABLET | Freq: Once | ORAL | 0 refills | Status: AC

## 2016-07-21 MED ORDER — METRONIDAZOLE 0.75 % VA GEL: 1.0000 | Freq: Every day | VAGINAL | 0 refills | Status: DC

## 2016-07-22 MED ORDER — AMOXICILLIN 500 MG PO TABS: 500.0000 mg | ORAL_TABLET | Freq: Two times a day (BID) | ORAL | 0 refills | Status: AC

## 2016-07-23 NOTE — Progress Notes (Signed)
Encounter reviewed by Dr. Aundria Rud. Has appt on 08/05/16 for annual exam.  Can check for IUD strings again then.

## 2016-08-05 ENCOUNTER — Encounter: Payer: Self-pay | Admitting: Certified Nurse Midwife

## 2016-08-05 ENCOUNTER — Ambulatory Visit (INDEPENDENT_AMBULATORY_CARE_PROVIDER_SITE_OTHER): Payer: BC Managed Care – PPO | Admitting: Certified Nurse Midwife

## 2016-08-05 VITALS — BP 110/74 | HR 70 | Resp 16 | Ht 66.25 in | Wt 211.0 lb

## 2016-08-05 DIAGNOSIS — Z0000 Encounter for general adult medical examination without abnormal findings: Secondary | ICD-10-CM | POA: Diagnosis not present

## 2016-08-05 DIAGNOSIS — R350 Frequency of micturition: Secondary | ICD-10-CM | POA: Diagnosis not present

## 2016-08-05 DIAGNOSIS — N898 Other specified noninflammatory disorders of vagina: Secondary | ICD-10-CM

## 2016-08-05 DIAGNOSIS — Z01419 Encounter for gynecological examination (general) (routine) without abnormal findings: Secondary | ICD-10-CM

## 2016-08-05 LAB — POCT URINALYSIS DIPSTICK
Bilirubin, UA: NEGATIVE
Blood, UA: NEGATIVE
Glucose, UA: NEGATIVE
Ketones, UA: NEGATIVE
Nitrite, UA: NEGATIVE
Protein, UA: NEGATIVE
Urobilinogen, UA: NEGATIVE
pH, UA: 5

## 2016-08-05 NOTE — Progress Notes (Signed)
40 y.o. [id number] Divorced African American Fe here for annual exam. Periods none with Mirena IUD. No problems with IUD. Complaining of urinary frequency,urgency at times with urination. Treated 2 weeks ago for UTI,BV,Yeast vaginitis with Metrogel,Diflucan and Amoxil. Feels like she it resolved, but now having some irritation externally only now. Drinks very little water and some soda. No STD concerns, screening 2 weeks ago negative. Sees Urgent care if needed. No other health concerns today.  No LMP recorded. Patient is not currently having periods (Reason: IUD).          Sexually active: Yes.    The current method of family planning is condoms & iud.    Exercising: No.  exercise Smoker:  no  Health Maintenance: Pap:  07-31-15 neg HPV HR neg MMG:  2013 birads 1:neg Colonoscopy: none BMD:   none TDaP:  2010 Shingles: no Pneumonia: no Hep C and HIV: Hep c neg 2016, HIV neg 2017 Labs: poct urine-wbc 2+ Self breast exam: done monthly   reports that she has never smoked. She has never used smokeless tobacco. She reports that she drinks about 0.6 - 1.2 oz of alcohol per week . She reports that she does not use drugs.  Past Medical History:  Diagnosis Date  . Allergy   . Asthma   . History of migraine   . Hx of migraines   . STD (sexually transmitted disease) 20 years ago   [place]    Past Surgical History:  Procedure Laterality Date  . BREAST BIOPSY     x2  . BREAST REDUCTION SURGERY  2003    H to DD  . INTRAUTERINE DEVICE (IUD) INSERTION  11/27/2013   Mirena  . 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 levonorgestrel (MIRENA) 20 MCG/24HR IUD 1 each by Intrauterine route once. Inserted 11/17/13     No current facility-administered medications for this visit.     Family History  Problem Relation Age of Onset  . Alcohol abuse Mother   . Alcohol abuse Father   . Drug abuse Father   . Depression Father   .  Bipolar disorder Father   . Diabetes Father   . Breast cancer Maternal Grandmother     ROS:  Pertinent items are noted in HPI.  Otherwise, a comprehensive ROS was negative.  Exam:   BP 110/74   Pulse 70   Resp 16   Ht 5' 6.25" (1.683 m)   Wt 211 lb (95.7 kg)   BMI 33.80 kg/m  Height: 5' 6.25" (168.3 cm) Ht Readings from Last 3 Encounters:  08/05/16 5' 6.25" (1.683 m)  07/20/16 5' 6.25" (1.683 m)  05/18/16 5' 6.25" (1.683 m)    General appearance: alert, cooperative and appears stated age Head: Normocephalic, without obvious abnormality, atraumatic Neck: no adenopathy, supple, symmetrical, trachea midline and thyroid normal to inspection and palpation Lungs: clear to auscultation bilaterally CVAT negative bilateral Breasts: normal appearance, no masses or tenderness, No nipple retraction or dimpling, No nipple discharge or bleeding, No axillary or supraclavicular adenopathy Heart: regular rate and rhythm Abdomen: soft, non-tender; no masses,  no organomegaly, negative suprapubic Extremities: extremities normal, atraumatic, no cyanosis or edema Skin: Skin color, texture, turgor normal. No rashes or lesions Lymph nodes: Cervical, supraclavicular, and axillary nodes normal. No abnormal inguinal nodes palpated Neurologic: Grossly normal   Pelvic: External genitalia:  no lesions  Urethra:  normal appearing urethra with no masses, tenderness or lesions  Bladder and urethral meatus non tender              Bartholin's and Skene's: normal                 Vagina: normal appearing vagina with normal color and  Odorous discharge, no lesions. Affirm taken              Cervix: cervical motion tenderness, no cervical motion tenderness, no lesions and IUD string noted in cervix              Pap taken: No. Bimanual Exam:  Uterus:  normal size, contour, position, consistency, mobility, non-tender              Adnexa: normal adnexa and no mass, fullness, tenderness                Rectovaginal: Confirms               Anus:  normal sphincter tone, no lesions  Chaperone present: yes  A:  Well Woman with normal exam  Contraception Mirena IUD  R/O UTI  R/O vaginal infection due to irritation  P:   Reviewed health and wellness pertinent to exam  Warning signs of IUD given and need to advise if occurs.  Due for removal 11/18/2018  Increase water intake and decrease soda intake. Will treat if indicated. Warning signs of UTI given.  Lab: Urine micro  Will treat per affirm. Discussed Aveeno sitz bath for comfort until lab results in.  Lab: Affirm  Pap smear as above not taken  counseled on breast self exam, mammography screening at age 33( she has been seen at [facility] and will call and make appt. When 41), STD prevention, HIV risk factors and prevention, adequate intake of calcium and vitamin D, diet and exercise  return annually or prn  An After Visit Summary was printed and given to the patient.

## 2016-08-05 NOTE — Patient Instructions (Signed)

## 2016-08-06 ENCOUNTER — Encounter: Payer: Self-pay | Admitting: Obstetrics and Gynecology

## 2016-08-06 ENCOUNTER — Other Ambulatory Visit: Payer: Self-pay | Admitting: Certified Nurse Midwife

## 2016-08-06 DIAGNOSIS — N760 Acute vaginitis: Principal | ICD-10-CM

## 2016-08-06 DIAGNOSIS — B9689 Other specified bacterial agents as the cause of diseases classified elsewhere: Secondary | ICD-10-CM

## 2016-08-06 LAB — URINALYSIS, MICROSCOPIC ONLY
Bacteria, UA: NONE SEEN [HPF]
Casts: NONE SEEN [LPF]
Crystals: NONE SEEN [HPF]
Yeast: NONE SEEN [HPF]

## 2016-08-06 LAB — WET PREP BY MOLECULAR PROBE
Candida species: NEGATIVE
GARDNERELLA VAGINALIS: POSITIVE — AB
TRICHOMONAS VAG: NEGATIVE

## 2016-08-06 MED ORDER — CLINDAMYCIN PHOSPHATE 2 % VA CREA: 1.0000 | TOPICAL_CREAM | Freq: Every day | VAGINAL | 0 refills | Status: DC

## 2016-08-07 NOTE — Progress Notes (Signed)
Reviewed personally.  M. Suzanne Jamica Woodyard, MD.  

## 2016-08-17 ENCOUNTER — Encounter: Payer: Self-pay | Admitting: Nurse Practitioner

## 2016-08-17 ENCOUNTER — Ambulatory Visit (INDEPENDENT_AMBULATORY_CARE_PROVIDER_SITE_OTHER): Payer: BC Managed Care – PPO | Admitting: Nurse Practitioner

## 2016-08-17 VITALS — BP 108/66 | HR 72 | Temp 98.8°F | Wt 211.0 lb

## 2016-08-17 DIAGNOSIS — K648 Other hemorrhoids: Secondary | ICD-10-CM | POA: Diagnosis not present

## 2016-08-17 DIAGNOSIS — N760 Acute vaginitis: Secondary | ICD-10-CM

## 2016-08-17 MED ORDER — LIDOCAINE 5 % EX OINT: 1.0000 "application " | TOPICAL_OINTMENT | CUTANEOUS | 0 refills | Status: DC | PRN

## 2016-08-17 MED ORDER — METRONIDAZOLE 500 MG PO TABS: 500.0000 mg | ORAL_TABLET | Freq: Two times a day (BID) | ORAL | 0 refills | Status: DC

## 2016-08-17 MED ORDER — FLUCONAZOLE 150 MG PO TABS: 150.0000 mg | ORAL_TABLET | Freq: Once | ORAL | 1 refills | Status: AC

## 2016-08-17 MED ORDER — HYDROCORTISONE 2.5 % RE CREA: 1.0000 "application " | TOPICAL_CREAM | Freq: Two times a day (BID) | RECTAL | 1 refills | Status: DC

## 2016-08-17 NOTE — Progress Notes (Signed)
Patient ID: Audrey Jackson, female   DOB: 1976/05/10, 41 y.o.   MRN: [id number]  41 y.o. Divorced [place] American female [id number] here with complaint of vaginal symptoms of itching, burning, and increase discharge. Describes discharge as yellow but no odor as before.  She was treated for BV and yeast recently with Metrogel & Diflucan.she then came back for AEX and BV was still there and given Cleocin vaginal cream.  She does not feel that she ever got over the infection.  She did take a backup of 1 Diflucan ag=after finishing the Cleocin.  During this time she did have group B strep in the urine and was treated with Amoxil - that gave her bad diarrhea.  She now has a rectal hemorrhoid that is also very painful. She has felt really bad for past 24 hours and did not go to work today. She is with the same partner and no history of infidelity. She had STD's checked on 07/20/2016 which were negative.  Denies UA symptoms today but does have pain and discomfort in general at the perianal area.  Contraception is IUD.  She has not yet started back on Boric acid capsules.   O:  Healthy female WDWN Affect: normal, orientation x 3  Exam: feels distressed with pain and rectal discomfort Abdomen: soft and non tender Lymph node: no enlargement or tenderness Pelvic exam: External genital: normal female with redness and thin yellow discharge. BUS: negative Vagina: thin yellow discharge noted.  Affirm taken. Cervix: normal, non tender, no CMT, IUD string is visible Rectal:  A fairly large hemorrhoid with ability to gently move back in with some discomfort and without thrombosis.   Anoscope was not done due to pain at the rectum.   A: Vaginitis - most likely recurrent BV  Rectal hemorrhoid  Mirena IUD for contraception     P: Discussed findings of vaginitis and etiology. Discussed Aveeno or baking soda sitz bath for comfort. Avoid moist clothes or pads for extended period of time. If working out in gym clothes  or swim suits for long periods of time change underwear or bottoms of swimsuit if possible. Olive Oil/Coconut Oil use for skin protection prior to activity can be used to external skin.  Rx: will start her on Flagyl po today  Back up RX of Diflucan if needed  She is given Anusol cream and Lidocaine to use prn for rectal hemorrhoid  Continue with warm sitz bath 4 times a day   Follow with Affirm  Note given for work today  She still may want Boric acid to use prn  Will get a progress report tomorrow RV prn

## 2016-08-17 NOTE — Patient Instructions (Signed)
Hemorrhoids Hemorrhoids are swollen veins in and around the rectum or anus. There are two types of hemorrhoids:  Internal hemorrhoids. These occur in the veins that are just inside the rectum. They may poke through to the outside and become irritated and painful.  External hemorrhoids. These occur in the veins that are outside of the anus and can be felt as a painful swelling or hard lump near the anus.  Most hemorrhoids do not cause serious problems, and they can be managed with home treatments such as diet and lifestyle changes. If home treatments do not help your symptoms, procedures can be done to shrink or remove the hemorrhoids. What are the causes? This condition is caused by increased pressure in the anal area. This pressure may result from various things, including:  Constipation.  Straining to have a bowel movement.  Diarrhea.  Pregnancy.  Obesity.  Sitting for long periods of time.  Heavy lifting or other activity that causes you to strain.  Anal sex.  What are the signs or symptoms? Symptoms of this condition include:  Pain.  Anal itching or irritation.  Rectal bleeding.  Leakage of stool (feces).  Anal swelling.  One or more lumps around the anus.  How is this diagnosed? This condition can often be diagnosed through a visual exam. Other exams or tests may also be done, such as:  Examination of the rectal area with a gloved hand (digital rectal exam).  Examination of the anal canal using a small tube (anoscope).  A blood test, if you have lost a significant amount of blood.  A test to look inside the colon (sigmoidoscopy or colonoscopy).  How is this treated? This condition can usually be treated at home. However, various procedures may be done if dietary changes, lifestyle changes, and other home treatments do not help your symptoms. These procedures can help make the hemorrhoids smaller or remove them completely. Some of these procedures involve  surgery, and others do not. Common procedures include:  Rubber band ligation. Rubber bands are placed at the base of the hemorrhoids to cut off the blood supply to them.  Sclerotherapy. Medicine is injected into the hemorrhoids to shrink them.  Infrared coagulation. A type of light energy is used to get rid of the hemorrhoids.  Hemorrhoidectomy surgery. The hemorrhoids are surgically removed, and the veins that supply them are tied off.  Stapled hemorrhoidopexy surgery. A circular stapling device is used to remove the hemorrhoids and use staples to cut off the blood supply to them.  Follow these instructions at home: Eating and drinking  Eat foods that have a lot of fiber in them, such as whole grains, beans, nuts, fruits, and vegetables. Ask your health care provider about taking products that have added fiber (fiber supplements).  Drink enough fluid to keep your urine clear or pale yellow. Managing pain and swelling  Take warm sitz baths for 20 minutes, 3-4 times a day to ease pain and discomfort.  If directed, apply ice to the affected area. Using ice packs between sitz baths may be helpful. ? Put ice in a plastic bag. ? Place a towel between your skin and the bag. ? Leave the ice on for 20 minutes, 2-3 times a day. General instructions  Take over-the-counter and prescription medicines only as told by your health care provider.  Use medicated creams or suppositories as told.  Exercise regularly.  Go to the bathroom when you have the urge to have a bowel movement. Do not wait.    Avoid straining to have bowel movements.  Keep the anal area dry and clean. Use wet toilet paper or moist towelettes after a bowel movement.  Do not sit on the toilet for long periods of time. This increases blood pooling and pain. Contact a health care provider if:  You have increasing pain and swelling that are not controlled by treatment or medicine.  You have uncontrolled bleeding.  You  have difficulty having a bowel movement, or you are unable to have a bowel movement.  You have pain or inflammation outside the area of the hemorrhoids. This information is not intended to replace advice given to you by your health care provider. Make sure you discuss any questions you have with your health care provider. Document Released: 07/24/2000 Document Revised: 12/25/2015 Document Reviewed: 04/10/2015 Elsevier Interactive Patient Education  2017 Elsevier Inc.  

## 2016-08-18 ENCOUNTER — Other Ambulatory Visit: Payer: Self-pay | Admitting: Nurse Practitioner

## 2016-08-18 ENCOUNTER — Encounter: Payer: Self-pay | Admitting: *Deleted

## 2016-08-18 ENCOUNTER — Telehealth: Payer: Self-pay | Admitting: *Deleted

## 2016-08-18 LAB — WET PREP BY MOLECULAR PROBE
CANDIDA SPECIES: NEGATIVE
Gardnerella vaginalis: NEGATIVE
Trichomonas vaginosis: POSITIVE — AB

## 2016-08-18 NOTE — Telephone Encounter (Signed)
Pt notified of AFFIRM results.  Pt voices understanding of results and any questions answered. TOC appointment scheduled for 09/04/16 with Edman Circle, FNP.  She will have full STD testing at that time.

## 2016-08-18 NOTE — Progress Notes (Signed)
Please call pt with instructions.  She was given Flagyl yesterday for presumptive BV but now needs to take medication differently.

## 2016-08-18 NOTE — Telephone Encounter (Signed)
This encounter was created in error - please disregard.

## 2016-08-18 NOTE — Progress Notes (Signed)
Pt notified of AFFIRM results.  Pt voices understanding of results and any questions answered. TOC appointment scheduled for 09/04/16 with Edman Circle, FNP.  She will have full STD testing at that time.

## 2016-08-18 NOTE — Telephone Encounter (Signed)
-----   Message from Huey Romans, RN sent at 08/18/2016  9:22 AM EST -----   ----- Message ----- From: [name], FNP Sent: 08/18/2016   8:49 AM To: [facility] Triage Pool  Results via my chart:  [place],  The vaginal wet prep was negative for bacterial and yeast infection - but positive for trichomonas infection.  This is a sexually transmitted disease and your partner will need to be treated as well.  The Flagyl given to you yesterday will need to be taken a little different than originally prescribed.  You now need to take 2 gm - which is 4 of the 500 mg tablets at once time.  This may cause some GI symptoms of nausea so take with food and plenty of fluids.  You should return for a test of cure in about 2 weeks.  Avoid all sexual contact until you are retested.  The phone triage nurse will call you to review these instructions and answer any questions.

## 2016-08-23 NOTE — Progress Notes (Signed)
Encounter reviewed by Dr. Brook Amundson C. Silva.  

## 2016-09-04 ENCOUNTER — Ambulatory Visit (INDEPENDENT_AMBULATORY_CARE_PROVIDER_SITE_OTHER): Payer: BC Managed Care – PPO | Admitting: Nurse Practitioner

## 2016-09-04 ENCOUNTER — Encounter: Payer: Self-pay | Admitting: Nurse Practitioner

## 2016-09-04 VITALS — BP 120/74 | HR 60 | Ht 66.25 in | Wt 214.0 lb

## 2016-09-04 DIAGNOSIS — Z202 Contact with and (suspected) exposure to infections with a predominantly sexual mode of transmission: Secondary | ICD-10-CM | POA: Diagnosis not present

## 2016-09-04 DIAGNOSIS — Z113 Encounter for screening for infections with a predominantly sexual mode of transmission: Secondary | ICD-10-CM

## 2016-09-04 MED ORDER — NONFORMULARY OR COMPOUNDED ITEM: 12 refills | Status: DC

## 2016-09-04 NOTE — Progress Notes (Signed)
41 y.o. Divorced [place] American female [phone] here for follow up of trichomonas that was diagnosed on 08/17/16.  She was treated with Flagyl.  Her partner also went to his MD for treatment.  This is a new partner for her ~ 2-3 months.  Her wet prep 07/20/16 was negative for Trich.  She is here as a follow and to get rest of STD's.  During the visit on 1/8 was also having a flare with hemorrhoids -  that also is better.  She would like to get a refill on Boric acid capsules. She normally gets a PH change after when she would normally get a menses and after SA- with IUD will occasionally spot.    Completed all medication as directed.  Denies any symptoms of vaginal discharge, irritation or odor.    O: Healthy WD,WN female Affect: normal - no distress like last visit Abdomen: soft and non tender Pelvic exam:EXTERNAL GENITALIA: normal appearing vulva with no masses, tenderness or lesions VAGINA: discharge thin white to clear - Affirm is taken CERVIX: no lesions or cervical motion tenderness and IUD string is present  A: Trichomonas hopefully Resolved  R/O STD's - GC and Chlamydia done at last visit    P:  Discussed that results will be sent via my chart.  She is also given a refill on Boric acid capsules to use as directed.   Labs:  Affirm and STD's    RV

## 2016-09-04 NOTE — Patient Instructions (Signed)
Will send test results

## 2016-09-05 LAB — WET PREP BY MOLECULAR PROBE
Candida species: NEGATIVE
GARDNERELLA VAGINALIS: NEGATIVE
Trichomonas vaginosis: NEGATIVE

## 2016-09-05 LAB — STD PANEL
HIV: NONREACTIVE
Hepatitis B Surface Ag: NEGATIVE

## 2016-09-06 NOTE — Progress Notes (Signed)
Encounter reviewed by Dr. Brook Amundson C. Silva.  

## 2017-01-21 ENCOUNTER — Ambulatory Visit (INDEPENDENT_AMBULATORY_CARE_PROVIDER_SITE_OTHER): Payer: BC Managed Care – PPO | Admitting: Family Medicine

## 2017-01-21 ENCOUNTER — Encounter: Payer: Self-pay | Admitting: Family Medicine

## 2017-01-21 VITALS — BP 120/88 | HR 76 | Temp 98.4°F | Ht 66.0 in | Wt 213.6 lb

## 2017-01-21 DIAGNOSIS — F419 Anxiety disorder, unspecified: Secondary | ICD-10-CM

## 2017-01-21 DIAGNOSIS — F329 Major depressive disorder, single episode, unspecified: Secondary | ICD-10-CM

## 2017-01-21 DIAGNOSIS — Z7689 Persons encountering health services in other specified circumstances: Secondary | ICD-10-CM | POA: Diagnosis not present

## 2017-01-21 MED ORDER — SERTRALINE HCL 50 MG PO TABS: 50.0000 mg | ORAL_TABLET | Freq: Every day | ORAL | 3 refills | Status: DC

## 2017-01-21 NOTE — Progress Notes (Addendum)
Patient ID: Audrey Jackson, female   DOB: 08-Mar-1976, 41 y.o.   MRN: [id number]  Patient presents to clinic today to establish care.  Acute Concerns:  Depression has exacerbated over the past 6 months with increase in symptoms over the pasta week. She has a history of depression that started 8 years go and reports that this has been managed well without medication. She used a SSRI for one year and she then decreased and discontinued this medication. She has recently initiated counseling once weekly and she states this has provided moderate benefit however she feels that she needs to restart medication at this time as symptoms are returning. Her counselor also recommended that she seek medication in conjunction with counseling. Symptoms of feeling tired and decreased focus have been a concern lately. She denies appetite changes, suicidal or homicidal ideation.  History of migraines: She reports migraines have been well controlled and she has been off of preventive medication. She denies any recent headaches today.   Health Maintenance: Dental --Every 6 months; UTD Vision --Yearly; she is UTD; wears glasses Immunizations --Influenza vaccine in 2017; Tdap UTD in 2010 Colonoscopy --Not needed Mammogram --April 2013; Due to bilateral breast pain; recommended routine screening at age 22. PAP -- Last pap in 2016; normal per patient; followed by women[facility]     Past Medical History:  Diagnosis Date  . Allergy   . Asthma   . History of migraine   . Hx of migraines   . STD (sexually transmitted disease) 20 years ago   [place]    Past Surgical History:  Procedure Laterality Date  . BREAST BIOPSY     x2  . BREAST REDUCTION SURGERY  2003    H to DD  . INTRAUTERINE DEVICE (IUD) INSERTION  11/27/2013   Mirena  . 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 levonorgestrel (MIRENA) 20 MCG/24HR IUD 1  each by Intrauterine route once. Inserted 11/17/13    . NONFORMULARY OR COMPOUNDED ITEM Boric acid suppository 200mg , size 0 gelatin capsule,1 pv twice weekly.  With flare use daily pv for 3 days. 30 each 12   No current facility-administered medications on file prior to visit.     Allergies  Allergen Reactions  . Amoxil [Amoxicillin]     Severe diarrhea & vomiting  . Banana Other (See Comments)    Shortness of Breath  . Kiwi Extract Other (See Comments)    SOB  . Soy Allergy Itching    Family History  Problem Relation Age of Onset  . Alcohol abuse Mother   . Alcohol abuse Father   . Drug abuse Father   . Depression Father   . Bipolar disorder Father   . Diabetes Father   . Breast cancer Maternal Grandmother     Social History   Social History  . Marital status: Divorced    Spouse name: N/A  . Number of children: N/A  . Years of education: N/A   Occupational History  . Not on file.   Social History Main Topics  . Smoking status: Never Smoker  . Smokeless tobacco: Never Used  . Alcohol use 0.6 - 1.2 oz/week    1 - 2 Standard drinks or equivalent per week  . Drug use: No  . Sexual activity: Yes    Partners: Male    Birth control/ protection: Condom, IUD   Other Topics Concern  .  Not on file   Social History Narrative  . No narrative on file    Review of Systems  Constitutional: Negative for chills, diaphoresis and malaise/fatigue.  Respiratory: Negative for cough, sputum production, shortness of breath and wheezing.   Cardiovascular: Negative for chest pain, palpitations and leg swelling.  Gastrointestinal: Negative for abdominal pain, diarrhea, heartburn, nausea and vomiting.  Genitourinary: Negative for dysuria.  Musculoskeletal: Negative for myalgias.  Skin: Negative for rash.  Neurological: Negative for dizziness, tingling and headaches.  Psychiatric/Behavioral: Negative for substance abuse and suicidal ideas.       Mild depression and anxiety     Past Medical History:  Diagnosis Date  . Allergy   . Asthma   . History of migraine   . Hx of migraines   . STD (sexually transmitted disease) 20 years ago   [place] History  . Marital status: Divorced    Spouse name: N/A  . Number of children: N/A  . Years of education: N/A   Occupational History  . Not on file.   Social History Main Topics  . Smoking status: Never Smoker  . Smokeless tobacco: Never Used  . Alcohol use 0.6 - 1.2 oz/week    1 - 2 Standard drinks or equivalent per week  . Drug use: No  . Sexual activity: Yes    Partners: Male    Birth control/ protection: Condom, IUD   Other Topics Concern  . Not on file   Social History Narrative  . No narrative on file    Past Surgical History:  Procedure Laterality Date  . BREAST BIOPSY     x2  . BREAST REDUCTION SURGERY  2003    H to DD  . INTRAUTERINE DEVICE (IUD) INSERTION  11/27/2013   Mirena  . 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 Alcohol abuse Mother   . Alcohol abuse Father   . Drug abuse Father   . Depression Father   . Bipolar disorder Father   . Diabetes Father   . Breast cancer Maternal Grandmother     Allergies  Allergen Reactions  . Amoxil [Amoxicillin]     Severe diarrhea & vomiting  . Banana Other (See Comments)    Shortness of Breath  . Kiwi Extract Other (See Comments)    SOB  . Soy Allergy Itching    Current Outpatient Prescriptions on File Prior to Visit  Medication Sig Dispense Refill  . levonorgestrel (MIRENA) 20 MCG/24HR IUD 1 each by Intrauterine route once. Inserted 11/17/13    . NONFORMULARY OR COMPOUNDED ITEM Boric acid suppository 200mg , size 0 gelatin capsule,1 pv twice weekly.  With flare use daily pv for 3 days. 30 each 12   No current facility-administered medications on file prior to visit.     BP 120/88 (BP Location: Left Arm, Patient Position: Sitting, Cuff Size: Normal)    Pulse 76   Temp 98.4 F (36.9 C) (Oral)   Ht 5\' 6"  (1.676 m)   Wt 213 lb 9.6 oz (96.9 kg)   SpO2 98%   BMI 34.48 kg/m     BP 120/88 (BP Location: Left Arm, Patient Position: Sitting, Cuff Size: Normal)   Pulse 76   Temp 98.4 F (36.9 C) (Oral)   Ht 5\' 6"  (1.676 m)   Wt 213 lb 9.6 oz (96.9 kg)   SpO2 98%  BMI 34.48 kg/m   Physical Exam  Constitutional: She is well-developed, well-nourished, and in no distress.  Eyes: Pupils are equal, round, and reactive to light. No scleral icterus.  Neck: Neck supple.  Cardiovascular: Normal rate, regular rhythm and intact distal pulses.   Pulmonary/Chest: Effort normal and breath sounds normal. She has no wheezes. She has no rales.  Abdominal: Soft. Bowel sounds are normal. There is no tenderness.  Musculoskeletal: She exhibits no edema.  Lymphadenopathy:    She has no cervical adenopathy.  Neurological: She is alert.  Skin: Skin is warm and dry. No rash noted.  Psychiatric: Mood, memory, affect and judgment normal.   Assessment/Plan: 1. Anxiety and depression PHQ-4: Mild depression; advised continued counseling and will initiate sertraline as this has provided benefit to her previously and she is requesting to restart medication at this time. She declined lab work today and we agreed this will be completed at physical. I instructed pt to start 1/2 tablet once daily for 1 week and then increase to a full tablet once daily on week two as tolerated.  We discussed common side effects such as nausea, drowsiness and weight gain.  Also discussed rare but serious side effect of suicide ideation.  She is instructed to discontinue medication go directly to ED if this occurs.  Pt verbalizes understanding.  Plan follow up in 1 month to evaluate progress. - sertraline (ZOLOFT) 50 MG tablet; Take 1 tablet (50 mg total) by mouth daily.  Dispense: 30 tablet; Refill: 3  2. Encounter to establish care We reviewed the PMH, PSH, FH, SH, Meds and  Allergies. -We provided refills for any medications we will prescribe as needed. -We addressed current concerns per orders and patient instructions. -We have asked for records for pertinent exams, studies, vaccines and notes from previous providers. -We have advised patient to follow up per instructions below.   -Patient advised to return or notify a provider immediately if symptoms worsen or persist or new concerns arise.  Advised her to follow up for a CPE and lab work as she is due for this at this time. Also advised her to follow up in one month for evaluation of depression/anxiety and medication effectiveness.  Delano Metz, FNP-C

## 2017-01-21 NOTE — Addendum Note (Signed)
Addended by: Delano Metz A on: 01/21/2017 08:28 PM   Modules accepted: Level of Service

## 2017-01-21 NOTE — Patient Instructions (Signed)
It was a pleasure to see you today. Please follow up in one month for evaluation of symptoms and effectiveness of medication. Also, please schedule a physical and lab work within the next 2 months.    Living With Depression Everyone experiences occasional disappointment, sadness, and loss in their lives. When you are feeling down, blue, or sad for at least 2 weeks in a row, it may mean that you have depression. Depression can affect your thoughts and feelings, relationships, daily activities, and physical health. It is caused by changes in the way your brain functions. If you receive a diagnosis of depression, your health care provider will tell you which type of depression you have and what treatment options are available to you. If you are living with depression, there are ways to help you recover from it and also ways to prevent it from coming back. How to cope with lifestyle changes Coping with stress Stress is your body's reaction to life changes and events, both good and bad. Stressful situations may include:  Getting married.  The death of a spouse.  Losing a job.  Retiring.  Having a baby.  Stress can last just a few hours or it can be ongoing. Stress can play a major role in depression, so it is important to learn both how to cope with stress and how to think about it differently. Talk with your health care provider or a counselor if you would like to learn more about stress reduction. He or she may suggest some stress reduction techniques, such as:  Music therapy. This can include creating music or listening to music. Choose music that you enjoy and that inspires you.  Mindfulness-based meditation. This kind of meditation can be done while sitting or walking. It involves being aware of your normal breaths, rather than trying to control your breathing.  Centering prayer. This is a kind of meditation that involves focusing on a spiritual word or phrase. Choose a word, phrase, or  sacred image that is meaningful to you and that brings you peace.  Deep breathing. To do this, expand your stomach and inhale slowly through your nose. Hold your breath for 3-5 seconds, then exhale slowly, allowing your stomach muscles to relax.  Muscle relaxation. This involves intentionally tensing muscles then relaxing them.  Choose a stress reduction technique that fits your lifestyle and personality. Stress reduction techniques take time and practice to develop. Set aside 5-15 minutes a day to do them. Therapists can offer training in these techniques. The training may be covered by some insurance plans. Other things you can do to manage stress include:  Keeping a stress diary. This can help you learn what triggers your stress and ways to control your response.  Understanding what your limits are and saying no to requests or events that lead to a schedule that is too full.  Thinking about how you respond to certain situations. You may not be able to control everything, but you can control how you react.  Adding humor to your life by watching funny films or TV shows.  Making time for activities that help you relax and not feeling guilty about spending your time this way.  Medicines Your health care provider may suggest certain medicines if he or she feels that they will help improve your condition. Avoid using alcohol and other substances that may prevent your medicines from working properly (may interact). It is also important to:  Talk with your pharmacist or health care provider about all  the medicines that you take, their possible side effects, and what medicines are safe to take together.  Make it your goal to take part in all treatment decisions (shared decision-making). This includes giving input on the side effects of medicines. It is best if shared decision-making with your health care provider is part of your total treatment plan.  If your health care provider prescribes a  medicine, you may not notice the full benefits of it for 4-8 weeks. Most people who are treated for depression need to be on medicine for at least 6-12 months after they feel better. If you are taking medicines as part of your treatment, do not stop taking medicines without first talking to your health care provider. You may need to have the medicine slowly decreased (tapered) over time to decrease the risk of harmful side effects. Relationships Your health care provider may suggest family therapy along with individual therapy and drug therapy. While there may not be family problems that are causing you to feel depressed, it is still important to make sure your family learns as much as they can about your mental health. Having your family's support can help make your treatment successful. How to recognize changes in your condition Everyone has a different response to treatment for depression. Recovery from major depression happens when you have not had signs of major depression for two months. This may mean that you will start to:  Have more interest in doing activities.  Feel less hopeless than you did 2 months ago.  Have more energy.  Overeat less often, or have better or improving appetite.  Have better concentration.  Your health care provider will work with you to decide the next steps in your recovery. It is also important to recognize when your condition is getting worse. Watch for these signs:  Having fatigue or low energy.  Eating too much or too little.  Sleeping too much or too little.  Feeling restless, agitated, or hopeless.  Having trouble concentrating or making decisions.  Having unexplained physical complaints.  Feeling irritable, angry, or aggressive.  Get help as soon as you or your family members notice these symptoms coming back. How to get support and help from others How to talk with friends and family members about your condition Talking to friends and  family members about your condition can provide you with one way to get support and guidance. Reach out to trusted friends or family members, explain your symptoms to them, and let them know that you are working with a health care provider to treat your depression. Financial resources Not all insurance plans cover mental health care, so it is important to check with your insurance carrier. If paying for co-pays or counseling services is a problem, search for a local or county mental health care center. They may be able to offer public mental health care services at low or no cost when you are not able to see a private health care provider. If you are taking medicine for depression, you may be able to get the generic form, which may be less expensive. Some makers of prescription medicines also offer help to patients who cannot afford the medicines they need. Follow these instructions at home:  Get the right amount and quality of sleep.  Cut down on using caffeine, tobacco, alcohol, and other potentially harmful substances.  Try to exercise, such as walking or lifting small weights.  Take over-the-counter and prescription medicines only as told by your health care  provider.  Eat a healthy diet that includes plenty of vegetables, fruits, whole grains, low-fat dairy products, and lean protein. Do not eat a lot of foods that are high in solid fats, added sugars, or salt.  Keep all follow-up visits as told by your health care provider. This is important. Contact a health care provider if:  You stop taking your antidepressant medicines, and you have any of these symptoms: ? Nausea. ? Headache. ? Feeling lightheaded. ? Chills and body aches. ? Not being able to sleep (insomnia).  You or your friends and family think your depression is getting worse. Get help right away if:  You have thoughts of hurting yourself or others. If you ever feel like you may hurt yourself or others, or have thoughts  about taking your own life, get help right away. You can go to your nearest emergency department or call:  Your local emergency services (911 in the [place]  A suicide crisis helpline, such as the [facility] at 315-404-1148. This is open 24-hours a day.  Summary  If you are living with depression, there are ways to help you recover from it and also ways to prevent it from coming back.  Work with your health care team to create a management plan that includes counseling, stress management techniques, and healthy lifestyle habits. This information is not intended to replace advice given to you by your health care provider. Make sure you discuss any questions you have with your health care provider. Document Released: 06/29/2016 Document Revised: 06/29/2016 Document Reviewed: 06/29/2016 Elsevier Interactive Patient Education  [employer].

## 2017-01-25 ENCOUNTER — Emergency Department (HOSPITAL_COMMUNITY): Payer: BC Managed Care – PPO

## 2017-01-25 ENCOUNTER — Encounter (HOSPITAL_COMMUNITY)
Admission: EM | Disposition: A | Discharge status: Home / Self Care | Payer: Self-pay | Source: Home / Self Care | Attending: Emergency Medicine

## 2017-01-25 ENCOUNTER — Observation Stay (HOSPITAL_COMMUNITY)
Admission: EM | Admit: 2017-01-25 | Discharge: 2017-01-26 | Disposition: A | Discharge status: Home / Self Care | Payer: BC Managed Care – PPO | Attending: Emergency Medicine | Admitting: Emergency Medicine

## 2017-01-25 ENCOUNTER — Observation Stay (HOSPITAL_COMMUNITY): Payer: BC Managed Care – PPO | Admitting: Anesthesiology

## 2017-01-25 ENCOUNTER — Encounter (HOSPITAL_COMMUNITY): Payer: Self-pay

## 2017-01-25 DIAGNOSIS — D259 Leiomyoma of uterus, unspecified: Secondary | ICD-10-CM | POA: Insufficient documentation

## 2017-01-25 DIAGNOSIS — F419 Anxiety disorder, unspecified: Secondary | ICD-10-CM | POA: Insufficient documentation

## 2017-01-25 DIAGNOSIS — K353 Acute appendicitis with localized peritonitis, without perforation or gangrene: Secondary | ICD-10-CM

## 2017-01-25 DIAGNOSIS — K37 Unspecified appendicitis: Secondary | ICD-10-CM | POA: Diagnosis present

## 2017-01-25 DIAGNOSIS — F329 Major depressive disorder, single episode, unspecified: Secondary | ICD-10-CM | POA: Diagnosis not present

## 2017-01-25 DIAGNOSIS — Z79899 Other long term (current) drug therapy: Secondary | ICD-10-CM | POA: Insufficient documentation

## 2017-01-25 DIAGNOSIS — K3580 Unspecified acute appendicitis: Secondary | ICD-10-CM | POA: Diagnosis present

## 2017-01-25 HISTORY — DX: Anxiety disorder, unspecified: F41.9

## 2017-01-25 HISTORY — PX: APPENDECTOMY: SHX54

## 2017-01-25 HISTORY — DX: Headache, unspecified: R51.9

## 2017-01-25 HISTORY — DX: Headache: R51

## 2017-01-25 HISTORY — DX: Major depressive disorder, single episode, unspecified: F32.9

## 2017-01-25 HISTORY — PX: LAPAROSCOPIC APPENDECTOMY: SHX408

## 2017-01-25 HISTORY — DX: Depression, unspecified: F32.A

## 2017-01-25 LAB — URINALYSIS, ROUTINE W REFLEX MICROSCOPIC
BILIRUBIN URINE: NEGATIVE
Glucose, UA: NEGATIVE mg/dL
Hgb urine dipstick: NEGATIVE
KETONES UR: NEGATIVE mg/dL
LEUKOCYTES UA: NEGATIVE
Nitrite: NEGATIVE
PH: 6 (ref 5.0–8.0)
Protein, ur: NEGATIVE mg/dL
SPECIFIC GRAVITY, URINE: 1.012 (ref 1.005–1.030)

## 2017-01-25 LAB — COMPREHENSIVE METABOLIC PANEL
ALT: 13 U/L — ABNORMAL LOW (ref 14–54)
AST: 15 U/L (ref 15–41)
Albumin: 3.7 g/dL (ref 3.5–5.0)
Alkaline Phosphatase: 59 U/L (ref 38–126)
Anion gap: 7 (ref 5–15)
BUN: 5 mg/dL — AB (ref 6–20)
CHLORIDE: 108 mmol/L (ref 101–111)
CO2: 23 mmol/L (ref 22–32)
Calcium: 9 mg/dL (ref 8.9–10.3)
Creatinine, Ser: 0.79 mg/dL (ref 0.44–1.00)
GFR calc Af Amer: >60 mL/min (ref >60)
GFR calc non Af Amer: >60 mL/min (ref >60)
GLUCOSE: 104 mg/dL — AB (ref 65–99)
POTASSIUM: 4 mmol/L (ref 3.5–5.1)
Sodium: 138 mmol/L (ref 135–145)
Total Bilirubin: 0.8 mg/dL (ref 0.3–1.2)
Total Protein: 7.4 g/dL (ref 6.5–8.1)

## 2017-01-25 LAB — CBC
HEMATOCRIT: 42.3 % (ref 36.0–46.0)
Hemoglobin: 13.9 g/dL (ref 12.0–15.0)
MCH: 27.7 pg (ref 26.0–34.0)
MCHC: 32.9 g/dL (ref 30.0–36.0)
MCV: 84.4 fL (ref 78.0–100.0)
Platelets: 247 10*3/uL (ref 150–400)
RBC: 5.01 MIL/uL (ref 3.87–5.11)
RDW: 12.6 % (ref 11.5–15.5)
WBC: 9.8 10*3/uL (ref 4.0–10.5)

## 2017-01-25 LAB — LIPASE, BLOOD: LIPASE: 23 U/L (ref 11–51)

## 2017-01-25 LAB — I-STAT BETA HCG BLOOD, ED (MC, WL, AP ONLY): I-stat hCG, quantitative: <5 m[IU]/mL (ref <5)

## 2017-01-25 SURGERY — APPENDECTOMY, LAPAROSCOPIC
Anesthesia: General | Site: Abdomen

## 2017-01-25 MED ORDER — METRONIDAZOLE IN NACL 5-0.79 MG/ML-% IV SOLN
500.0000 mg | Freq: Once | INTRAVENOUS | Status: AC
  Administered 2017-01-25: 500 mg via INTRAVENOUS
  Filled 2017-01-25: qty 100

## 2017-01-25 MED ORDER — MORPHINE SULFATE (PF) 4 MG/ML IV SOLN
1.0000 mg | INTRAVENOUS | Status: DC | PRN
  Administered 2017-01-25: 4 mg via INTRAVENOUS
  Filled 2017-01-25: qty 1

## 2017-01-25 MED ORDER — ONDANSETRON HCL 4 MG/2ML IJ SOLN
4.0000 mg | Freq: Once | INTRAMUSCULAR | Status: AC
  Administered 2017-01-25: 4 mg via INTRAVENOUS
  Filled 2017-01-25: qty 2

## 2017-01-25 MED ORDER — ONDANSETRON HCL 4 MG/2ML IJ SOLN: 4.0000 mg | Freq: Four times a day (QID) | INTRAMUSCULAR | Status: DC | PRN

## 2017-01-25 MED ORDER — ONDANSETRON 4 MG PO TBDP: 4.0000 mg | ORAL_TABLET | Freq: Four times a day (QID) | ORAL | Status: DC | PRN

## 2017-01-25 MED ORDER — PROPOFOL 10 MG/ML IV BOLUS
INTRAVENOUS | Status: AC
  Filled 2017-01-25: qty 20

## 2017-01-25 MED ORDER — SODIUM CHLORIDE 0.9 % IR SOLN
Status: DC | PRN
  Administered 2017-01-25: 1000 mL

## 2017-01-25 MED ORDER — FENTANYL CITRATE (PF) 100 MCG/2ML IJ SOLN
INTRAMUSCULAR | Status: DC | PRN
  Administered 2017-01-25: 150 ug via INTRAVENOUS

## 2017-01-25 MED ORDER — HYDROMORPHONE HCL 1 MG/ML IJ SOLN: 0.2500 mg | INTRAMUSCULAR | Status: DC | PRN

## 2017-01-25 MED ORDER — MIDAZOLAM HCL 2 MG/2ML IJ SOLN
INTRAMUSCULAR | Status: DC | PRN
  Administered 2017-01-25: 2 mg via INTRAVENOUS

## 2017-01-25 MED ORDER — DEXTROSE 5 % IV SOLN
2.0000 g | Freq: Once | INTRAVENOUS | Status: AC
  Administered 2017-01-25: 2 g via INTRAVENOUS
  Filled 2017-01-25: qty 2

## 2017-01-25 MED ORDER — LIDOCAINE HCL (CARDIAC) 20 MG/ML IV SOLN
INTRAVENOUS | Status: DC | PRN
  Administered 2017-01-25: 30 mg via INTRAVENOUS

## 2017-01-25 MED ORDER — SODIUM CHLORIDE 0.9 % IV SOLN
INTRAVENOUS | Status: DC
  Administered 2017-01-25 (×2): via INTRAVENOUS

## 2017-01-25 MED ORDER — MORPHINE SULFATE (PF) 4 MG/ML IV SOLN
4.0000 mg | Freq: Once | INTRAVENOUS | Status: AC
  Administered 2017-01-25: 4 mg via INTRAVENOUS
  Filled 2017-01-25: qty 1

## 2017-01-25 MED ORDER — ROCURONIUM BROMIDE 10 MG/ML (PF) SYRINGE
PREFILLED_SYRINGE | INTRAVENOUS | Status: AC
  Filled 2017-01-25: qty 5

## 2017-01-25 MED ORDER — ACETAMINOPHEN 650 MG RE SUPP
650.0000 mg | Freq: Four times a day (QID) | RECTAL | Status: DC | PRN
Start: 2017-01-25 — End: 2017-01-26

## 2017-01-25 MED ORDER — SUCCINYLCHOLINE CHLORIDE 200 MG/10ML IV SOSY
PREFILLED_SYRINGE | INTRAVENOUS | Status: AC
  Filled 2017-01-25: qty 10

## 2017-01-25 MED ORDER — LIDOCAINE 2% (20 MG/ML) 5 ML SYRINGE
INTRAMUSCULAR | Status: AC
  Filled 2017-01-25: qty 5

## 2017-01-25 MED ORDER — GLYCOPYRROLATE 0.2 MG/ML IJ SOLN
INTRAMUSCULAR | Status: DC | PRN
  Administered 2017-01-25: .5 mg via INTRAVENOUS

## 2017-01-25 MED ORDER — SODIUM CHLORIDE 0.9 % IV BOLUS (SEPSIS)
1000.0000 mL | Freq: Once | INTRAVENOUS | Status: AC
  Administered 2017-01-25: 1000 mL via INTRAVENOUS

## 2017-01-25 MED ORDER — PROMETHAZINE HCL 25 MG/ML IJ SOLN: 6.2500 mg | INTRAMUSCULAR | Status: DC | PRN

## 2017-01-25 MED ORDER — 0.9 % SODIUM CHLORIDE (POUR BTL) OPTIME
TOPICAL | Status: DC | PRN
  Administered 2017-01-25: 1000 mL

## 2017-01-25 MED ORDER — ROCURONIUM BROMIDE 100 MG/10ML IV SOLN
INTRAVENOUS | Status: DC | PRN
  Administered 2017-01-25: 30 mg via INTRAVENOUS

## 2017-01-25 MED ORDER — PROMETHAZINE HCL 25 MG/ML IJ SOLN: 12.5000 mg | Freq: Four times a day (QID) | INTRAMUSCULAR | Status: DC | PRN

## 2017-01-25 MED ORDER — MORPHINE SULFATE (PF) 4 MG/ML IV SOLN
2.0000 mg | INTRAVENOUS | Status: DC | PRN
  Administered 2017-01-25: 4 mg via INTRAVENOUS
  Filled 2017-01-25: qty 1

## 2017-01-25 MED ORDER — OXYCODONE HCL 5 MG PO TABS
5.0000 mg | ORAL_TABLET | ORAL | Status: DC | PRN
  Administered 2017-01-26 (×3): 10 mg via ORAL
  Filled 2017-01-25 (×3): qty 2

## 2017-01-25 MED ORDER — ONDANSETRON HCL 4 MG/2ML IJ SOLN
INTRAMUSCULAR | Status: DC | PRN
  Administered 2017-01-25: 4 mg via INTRAVENOUS

## 2017-01-25 MED ORDER — ENOXAPARIN SODIUM 40 MG/0.4ML ~~LOC~~ SOLN
40.0000 mg | SUBCUTANEOUS | Status: DC
  Administered 2017-01-26: 40 mg via SUBCUTANEOUS
  Filled 2017-01-25: qty 0.4

## 2017-01-25 MED ORDER — NEOSTIGMINE METHYLSULFATE 5 MG/5ML IV SOSY
PREFILLED_SYRINGE | INTRAVENOUS | Status: AC
  Filled 2017-01-25: qty 5

## 2017-01-25 MED ORDER — ONDANSETRON HCL 4 MG/2ML IJ SOLN
INTRAMUSCULAR | Status: AC
  Filled 2017-01-25: qty 2

## 2017-01-25 MED ORDER — DEXAMETHASONE SODIUM PHOSPHATE 10 MG/ML IJ SOLN
INTRAMUSCULAR | Status: AC
  Filled 2017-01-25: qty 1

## 2017-01-25 MED ORDER — ACETAMINOPHEN 325 MG PO TABS
650.0000 mg | ORAL_TABLET | Freq: Four times a day (QID) | ORAL | Status: DC | PRN
  Administered 2017-01-26: 650 mg via ORAL
  Filled 2017-01-25: qty 2

## 2017-01-25 MED ORDER — BUPIVACAINE-EPINEPHRINE (PF) 0.25% -1:200000 IJ SOLN
INTRAMUSCULAR | Status: AC
  Filled 2017-01-25: qty 30

## 2017-01-25 MED ORDER — SUCCINYLCHOLINE CHLORIDE 20 MG/ML IJ SOLN
INTRAMUSCULAR | Status: DC | PRN
  Administered 2017-01-25: 100 mg via INTRAVENOUS

## 2017-01-25 MED ORDER — KETOROLAC TROMETHAMINE 30 MG/ML IJ SOLN: 30.0000 mg | Freq: Once | INTRAMUSCULAR | Status: DC | PRN

## 2017-01-25 MED ORDER — IOPAMIDOL (ISOVUE-300) INJECTION 61%
INTRAVENOUS | Status: AC
  Administered 2017-01-25: 100 mL
  Filled 2017-01-25: qty 100

## 2017-01-25 MED ORDER — METHOCARBAMOL 500 MG PO TABS
500.0000 mg | ORAL_TABLET | Freq: Three times a day (TID) | ORAL | Status: DC | PRN
  Administered 2017-01-25 – 2017-01-26 (×2): 500 mg via ORAL
  Filled 2017-01-25 (×2): qty 1

## 2017-01-25 MED ORDER — DEXAMETHASONE SODIUM PHOSPHATE 10 MG/ML IJ SOLN
INTRAMUSCULAR | Status: DC | PRN
  Administered 2017-01-25: 10 mg via INTRAVENOUS

## 2017-01-25 MED ORDER — PANTOPRAZOLE SODIUM 40 MG IV SOLR
40.0000 mg | Freq: Every day | INTRAVENOUS | Status: DC
  Administered 2017-01-25: 40 mg via INTRAVENOUS
  Filled 2017-01-25: qty 40

## 2017-01-25 MED ORDER — LACTATED RINGERS IV SOLN
INTRAVENOUS | Status: DC | PRN
  Administered 2017-01-25: 18:00:00 via INTRAVENOUS

## 2017-01-25 MED ORDER — BUPIVACAINE-EPINEPHRINE 0.25% -1:200000 IJ SOLN
INTRAMUSCULAR | Status: DC | PRN
  Administered 2017-01-25: 10 mL
  Administered 2017-01-25: 18 mL

## 2017-01-25 MED ORDER — MIDAZOLAM HCL 2 MG/2ML IJ SOLN
INTRAMUSCULAR | Status: AC
  Filled 2017-01-25: qty 2

## 2017-01-25 MED ORDER — DIPHENHYDRAMINE HCL 25 MG PO CAPS: 25.0000 mg | ORAL_CAPSULE | Freq: Four times a day (QID) | ORAL | Status: DC | PRN

## 2017-01-25 MED ORDER — FENTANYL CITRATE (PF) 250 MCG/5ML IJ SOLN
INTRAMUSCULAR | Status: AC
  Filled 2017-01-25: qty 5

## 2017-01-25 MED ORDER — NEOSTIGMINE METHYLSULFATE 10 MG/10ML IV SOLN
INTRAVENOUS | Status: DC | PRN
  Administered 2017-01-25: 3 mg via INTRAVENOUS

## 2017-01-25 MED ORDER — DIPHENHYDRAMINE HCL 50 MG/ML IJ SOLN
25.0000 mg | Freq: Four times a day (QID) | INTRAMUSCULAR | Status: DC | PRN
Start: 2017-01-25 — End: 2017-01-26

## 2017-01-25 SURGICAL SUPPLY — 48 items
APPLIER CLIP ROT 10 11.4 M/L (STAPLE)
BANDAGE ADH SHEER 1  50/CT (GAUZE/BANDAGES/DRESSINGS) IMPLANT
BENZOIN TINCTURE PRP APPL 2/3 (GAUZE/BANDAGES/DRESSINGS) ×3 IMPLANT
BLADE CLIPPER SURG (BLADE) IMPLANT
CANISTER SUCT 3000ML PPV (MISCELLANEOUS) ×3 IMPLANT
CHLORAPREP W/TINT 26ML (MISCELLANEOUS) ×3 IMPLANT
CLIP APPLIE ROT 10 11.4 M/L (STAPLE) IMPLANT
CLOSURE WOUND 1/2 X4 (GAUZE/BANDAGES/DRESSINGS) ×1
COVER SURGICAL LIGHT HANDLE (MISCELLANEOUS) ×3 IMPLANT
CUTTER FLEX LINEAR 45M (STAPLE) ×3 IMPLANT
DERMABOND ADVANCED (GAUZE/BANDAGES/DRESSINGS)
DERMABOND ADVANCED .7 DNX12 (GAUZE/BANDAGES/DRESSINGS) IMPLANT
DRSG TEGADERM 4X4.75 (GAUZE/BANDAGES/DRESSINGS) ×3 IMPLANT
ELECT REM PT RETURN 9FT ADLT (ELECTROSURGICAL) ×3
ELECTRODE REM PT RTRN 9FT ADLT (ELECTROSURGICAL) ×1 IMPLANT
ENDOLOOP SUT PDS II  0 18 (SUTURE)
ENDOLOOP SUT PDS II 0 18 (SUTURE) IMPLANT
GAUZE SPONGE 2X2 8PLY STRL LF (GAUZE/BANDAGES/DRESSINGS) ×1 IMPLANT
GLOVE BIOGEL M STRL SZ7.5 (GLOVE) ×3 IMPLANT
GLOVE BIOGEL PI IND STRL 8 (GLOVE) ×2 IMPLANT
GLOVE BIOGEL PI INDICATOR 8 (GLOVE) ×4
GOWN STRL REUS W/ TWL LRG LVL3 (GOWN DISPOSABLE) ×2 IMPLANT
GOWN STRL REUS W/TWL 2XL LVL3 (GOWN DISPOSABLE) ×3 IMPLANT
GOWN STRL REUS W/TWL LRG LVL3 (GOWN DISPOSABLE) ×4
GRASPER SUT TROCAR 14GX15 (MISCELLANEOUS) IMPLANT
KIT BASIN OR (CUSTOM PROCEDURE TRAY) ×3 IMPLANT
KIT ROOM TURNOVER OR (KITS) ×3 IMPLANT
NS IRRIG 1000ML POUR BTL (IV SOLUTION) ×3 IMPLANT
PAD ARMBOARD 7.5X6 YLW CONV (MISCELLANEOUS) ×6 IMPLANT
POUCH RETRIEVAL ECOSAC 10 (ENDOMECHANICALS) ×1 IMPLANT
POUCH RETRIEVAL ECOSAC 10MM (ENDOMECHANICALS) ×2
RELOAD 45 VASCULAR/THIN (ENDOMECHANICALS) ×3 IMPLANT
RELOAD STAPLE TA45 3.5 REG BLU (ENDOMECHANICALS) IMPLANT
SCISSORS LAP 5X35 DISP (ENDOMECHANICALS) IMPLANT
SET IRRIG TUBING LAPAROSCOPIC (IRRIGATION / IRRIGATOR) ×3 IMPLANT
SHEARS HARMONIC ACE PLUS 36CM (ENDOMECHANICALS) ×3 IMPLANT
SPECIMEN JAR SMALL (MISCELLANEOUS) ×3 IMPLANT
SPONGE GAUZE 2X2 STER 10/PKG (GAUZE/BANDAGES/DRESSINGS) ×2
STRIP CLOSURE SKIN 1/2X4 (GAUZE/BANDAGES/DRESSINGS) ×2 IMPLANT
SUT MNCRL AB 4-0 PS2 18 (SUTURE) ×3 IMPLANT
SUT VICRYL 0 UR6 27IN ABS (SUTURE) ×3 IMPLANT
TOWEL OR 17X24 6PK STRL BLUE (TOWEL DISPOSABLE) ×3 IMPLANT
TOWEL OR 17X26 10 PK STRL BLUE (TOWEL DISPOSABLE) ×3 IMPLANT
TRAY FOLEY CATH SILVER 16FR (SET/KITS/TRAYS/PACK) ×3 IMPLANT
TRAY LAPAROSCOPIC MC (CUSTOM PROCEDURE TRAY) ×3 IMPLANT
TROCAR XCEL BLADELESS 5X75MML (TROCAR) ×6 IMPLANT
TROCAR XCEL BLUNT TIP 100MML (ENDOMECHANICALS) ×3 IMPLANT
TUBING INSUFFLATION (TUBING) ×3 IMPLANT

## 2017-01-25 NOTE — ED Notes (Signed)
Returns from CT 

## 2017-01-25 NOTE — H&P (Signed)
[facility] Consult/Admission Note  Audrey Jackson 10/24/1975  [id number].    Requesting MD: Dr. Ralene Bathe Chief Complaint/Reason for Consult: Appendicitis  HPI:  Pt is a 41 year old female with a history of asthma and migraines who presented to the [facility] ED with complaints of abdominal pain for 2 days. Pt states pain started 2 days ago in her mid abdomen and localized to her RLQ yesterday. Pain is constant with episodes of sharp/stabbing pains that radiates into her into her lower back. Associated nausea and decreased stool. She denies vomiting, blood in her stool, fever, chills, chest pain, SOB. Pt has not eaten since last night. No previous abdominal surgeries. No anticoagulation.   ED course Labs: Unremarkable CT scan abdomen and pelvis: Appendix is enlarged and inflamed consistent with acute appendicitis. It is retrocecal in position. No abscesses noted.  ROS:  Review of Systems  Constitutional: Negative for chills and fever.  Respiratory: Negative for cough and shortness of breath.   Cardiovascular: Negative for chest pain.  Gastrointestinal: Positive for abdominal pain and nausea. Negative for blood in stool, constipation, diarrhea and vomiting.  Genitourinary: Negative for dysuria and hematuria.  Musculoskeletal: Positive for back pain.  Skin: Negative for rash.  Neurological: Negative for dizziness, loss of consciousness and weakness.     Family History  Problem Relation Age of Onset  . Alcohol abuse Mother   . Alcohol abuse Father   . Drug abuse Father   . Depression Father   . Bipolar disorder Father   . Diabetes Father   . Breast cancer Maternal Grandmother     Past Medical History:  Diagnosis Date  . Allergy   . Asthma   . History of migraine   . Hx of migraines   . STD (sexually transmitted disease) 20 years ago   [place]    Past Surgical History:  Procedure Laterality Date  . BREAST BIOPSY     x2  . BREAST REDUCTION SURGERY  2003    H to DD   . INTRAUTERINE DEVICE (IUD) INSERTION  11/27/2013   Mirena  . 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 She has never used smokeless tobacco. She reports that she drinks about 0.6 - 1.2 oz of alcohol per week . She reports that she does not use drugs.  Allergies:  Allergies  Allergen Reactions  . Amoxil [Amoxicillin] Diarrhea and Nausea And Vomiting    Has patient had a PCN reaction causing immediate rash, facial/tongue/throat swelling, SOB or lightheadedness with hypotension: Yes Has patient had a PCN reaction causing severe rash involving mucus membranes or skin necrosis: No Has patient had a PCN reaction that required hospitalization: No Has patient had a PCN reaction occurring within the last 10 years: No If all of the above answers are "NO", then may proceed with Cephalosporin use.   Severe diarrhea & vomiting  . Banana Other (See Comments)    Shortness of Breath  . Kiwi Extract Other (See Comments)    SOB  . Soy Allergy Itching     (Not in a hospital admission)  Blood pressure (!) 126/91, pulse 61, temperature 98.6 F (37 C), temperature source Oral, resp. rate 16, height 5' 6" (1.676 m), weight 210 lb (95.3 kg), SpO2 100 %.  Physical Exam  Constitutional: She is oriented to person, place, and time and well-developed, well-nourished, and in no distress. Vital signs are normal. No distress.  HENT:  Head: Normocephalic and atraumatic.  Nose: Nose normal.  Mouth/Throat: Oropharynx is clear and moist. No oropharyngeal exudate.  Eyes: Conjunctivae are normal. Pupils are equal, round, and reactive to light. Right eye exhibits no discharge. Left eye exhibits no discharge. No scleral icterus.  Neck: Normal range of motion. Neck supple. No tracheal deviation present. No thyromegaly present.  Cardiovascular: Normal rate, regular rhythm, normal heart sounds and intact distal pulses.  Exam reveals no gallop and no friction rub.    No murmur heard. Pulses:      Radial pulses are 2+ on the right side, and 2+ on the left side.       Dorsalis pedis pulses are 2+ on the right side, and 2+ on the left side.  Pulmonary/Chest: Effort normal and breath sounds normal. No respiratory distress. She has no decreased breath sounds. She has no wheezes. She has no rhonchi. She has no rales.  Abdominal: Soft. Normal appearance and bowel sounds are normal. She exhibits no distension and no mass. There is tenderness in the right lower quadrant and periumbilical area. There is tenderness at McBurney's point. There is no rigidity, no rebound and no guarding. No hernia.  Musculoskeletal: Normal range of motion. She exhibits no edema, tenderness or deformity.  Neurological: She is alert and oriented to person, place, and time.  Skin: Skin is warm and dry. No rash noted. She is not diaphoretic.  Psychiatric: Mood and affect normal.  Nursing note and vitals reviewed.   Results for orders placed or performed during the hospital encounter of 01/25/17 (from the past 48 hour(s))  Lipase, blood     Status: None   Collection Time: 01/25/17  9:14 AM  Result Value Ref Range   Lipase 23 11 - 51 U/L  Comprehensive metabolic panel     Status: Abnormal   Collection Time: 01/25/17  9:14 AM  Result Value Ref Range   Sodium 138 135 - 145 mmol/L   Potassium 4.0 3.5 - 5.1 mmol/L   Chloride 108 101 - 111 mmol/L   CO2 23 22 - 32 mmol/L   Glucose, Bld 104 (H) 65 - 99 mg/dL   BUN 5 (L) 6 - 20 mg/dL   Creatinine, Ser 0.79 0.44 - 1.00 mg/dL   Calcium 9.0 8.9 - 10.3 mg/dL   Total Protein 7.4 6.5 - 8.1 g/dL   Albumin 3.7 3.5 - 5.0 g/dL   AST 15 15 - 41 U/L   ALT 13 (L) 14 - 54 U/L   Alkaline Phosphatase 59 38 - 126 U/L   Total Bilirubin 0.8 0.3 - 1.2 mg/dL   GFR calc non Af Amer >60 >60 mL/min   GFR calc Af Amer >60 >60 mL/min    Comment: (NOTE) The eGFR has been calculated using the CKD EPI equation. This calculation has not been validated in all  clinical situations. eGFR's persistently <60 mL/min signify possible Chronic Kidney Disease.    Anion gap 7 5 - 15  CBC     Status: None   Collection Time: 01/25/17  9:14 AM  Result Value Ref Range   WBC 9.8 4.0 - 10.5 K/uL   RBC 5.01 3.87 - 5.11 MIL/uL   Hemoglobin 13.9 12.0 - 15.0 g/dL   HCT 42.3 36.0 - 46.0 %   MCV 84.4 78.0 - 100.0 fL   MCH 27.7 26.0 - 34.0 pg   MCHC 32.9 30.0 - 36.0 g/dL   RDW 12.6 11.5 - 15.5 %   Platelets 247 150 - 400  K/uL  Urinalysis, Routine w reflex microscopic     Status: None   Collection Time: 01/25/17  9:15 AM  Result Value Ref Range   Color, Urine YELLOW YELLOW   APPearance CLEAR CLEAR   Specific Gravity, Urine 1.012 1.005 - 1.030   pH 6.0 5.0 - 8.0   Glucose, UA NEGATIVE NEGATIVE mg/dL   Hgb urine dipstick NEGATIVE NEGATIVE   Bilirubin Urine NEGATIVE NEGATIVE   Ketones, ur NEGATIVE NEGATIVE mg/dL   Protein, ur NEGATIVE NEGATIVE mg/dL   Nitrite NEGATIVE NEGATIVE   Leukocytes, UA NEGATIVE NEGATIVE  I-Stat beta hCG blood, ED     Status: None   Collection Time: 01/25/17  9:23 AM  Result Value Ref Range   I-stat hCG, quantitative <5.0 <5 mIU/mL   Comment 3            Comment:   GEST. AGE      CONC.  (mIU/mL)   <=1 WEEK        5 - 50     2 WEEKS       50 - 500     3 WEEKS       100 - 10,000     4 WEEKS     1,000 - 30,000        FEMALE AND NON-PREGNANT FEMALE:     LESS THAN 5 mIU/mL    Ct Abdomen Pelvis W Contrast  Result Date: 01/25/2017 CLINICAL DATA:  Acute right lower quadrant abdominal pain. EXAM: CT ABDOMEN AND PELVIS WITH CONTRAST TECHNIQUE: Multidetector CT imaging of the abdomen and pelvis was performed using the standard protocol following bolus administration of intravenous contrast. CONTRAST:  124m ISOVUE-300 IOPAMIDOL (ISOVUE-300) INJECTION 61% COMPARISON:  None. FINDINGS: Lower chest: No acute abnormality. Hepatobiliary: No focal liver abnormality is seen. No gallstones, gallbladder wall thickening, or biliary dilatation.  Pancreas: Unremarkable. No pancreatic ductal dilatation or surrounding inflammatory changes. Spleen: Normal in size without focal abnormality. Adrenals/Urinary Tract: Adrenal glands are unremarkable. Kidneys are normal, without renal calculi, focal lesion, or hydronephrosis. Bladder is unremarkable. Stomach/Bowel: There is no evidence of bowel obstruction. The appendix is enlarged and inflamed consistent with appendicitis. It is retrocecal in position. Vascular/Lymphatic: No significant vascular findings are present. No enlarged abdominal or pelvic lymph nodes. Reproductive: Intrauterine device is noted. Multiple small fibroids are noted. No adnexal abnormality is noted. Other: No abdominal wall hernia or abnormality. No abdominopelvic ascites. Musculoskeletal: No acute or significant osseous findings. IMPRESSION: The appendix is enlarged and inflamed consistent with acute appendicitis. It is retrocecal in position. No abscess is noted. Electronically Signed   By: JMarijo Conception M.D.   On: 01/25/2017 12:15      Assessment/Plan  Asthma  Appendicitis - admit to [facility] - OR today for lap appy  FEN: NPO VTE: SCD's, lovenox 24 hrs post op  OR today.  JKalman Drape [facility] Center[facility] 01/25/2017, 1:14 PM Pager: [phone]: [phone]-Fri 7:00 am-4:30 pm Sat-Sun 7:00 am-11:30 am

## 2017-01-25 NOTE — ED Triage Notes (Signed)
Patient complains of RLQ pain that started on Saturday. Nausea and increased pain with ambulation. Denies urinary symptoms. Alert and oriented, NAD

## 2017-01-25 NOTE — Op Note (Signed)
Audrey Jackson [id number] 08-09-1976 01/25/2017  Appendectomy, Lap, Procedure Note  Indications: The patient presented with a history of right-sided abdominal pain. A CT revealed findings consistent with acute appendicitis.  Pre-operative Diagnosis: acute appendicitis  Post-operative Diagnosis: suppurative appendicitis; uterine fibroids  Surgeon: Gayland Curry   Assistants: none  Anesthesia: General endotracheal anesthesia  Procedure Details  The patient was seen again in the Holding Room. The risks, benefits, complications, treatment options, and expected outcomes were discussed with the patient and/or family. The possibilities of perforation of viscus, bleeding, recurrent infection, the need for additional procedures, failure to diagnose a condition, and creating a complication requiring transfusion or operation were discussed. There was concurrence with the proposed plan and informed consent was obtained. The site of surgery was properly noted. The patient was taken to Operating Room, identified as Nakisha Chai and the procedure verified as Appendectomy. A Time Out was held and the above information confirmed.  The patient was placed in the supine position and general anesthesia was induced, along with placement of orogastric tube, SCDs, and a Foley catheter. The abdomen was prepped and draped in a sterile fashion. A 1.5 centimeter infraumbilical incision was made.  The umbilical stalk was elevated, and the midline fascia was incised with a #11 blade.  A Kelly clamp was used to confirm entrance into the peritoneal cavity.  A pursestring suture was passed around the incision with a 0 Vicryl.  She had a small pre-existing fascial defect at her umbilicus which was enlarged. A 19mm Hasson was introduced into the abdomen and the tails of the suture were used to hold the Hasson in place.   The pneumoperitoneum was then established to steady pressure of 15 mmHg.  Additional 5 mm cannulas then  placed in the left lower quadrant of the abdomen and the suprapubic region under direct visualization. A careful evaluation of the entire abdomen was carried out. The patient was placed in Trendelenburg and left lateral decubitus position. The small intestines were retracted in the cephalad and left lateral direction away from the pelvis and right lower quadrant. There was evidence of several uterine fibroids. The patient was found to have an inflammed appendix that was extending slightly behind the cecum. There was some surrounding yellowish. There was no evidence of perforation.  The appendix was carefully dissected. The appendix was was skeletonized with the harmonic scalpel.   The appendix was divided at its base using an endo-GIA stapler with a white load. No appendiceal stump was left in place. The appendix was removed from the abdomen with an Ecco bag through the umbilical port.  There was no evidence of bleeding, leakage, or complication after division of the appendix. Irrigation was also performed and irrigate suctioned from the abdomen as well.  The umbilical port site was closed with the purse string suture. I did elect to place an additional interrupted 0 vicryl suture using the PMI suture passer at the umbilical fascia. The closure was viewed laparoscopically. There was no residual palpable fascial defect.  The trocar site skin wounds were closed with 4-0 Monocryl. Benzoin, steri-strips and bandages were applied to the skin incisions.  Instrument, sponge, and needle counts were correct at the conclusion of the case.   Findings: The appendix was found to be inflamed. There were not signs of necrosis.  There was not perforation. There was not abscess formation. There was some surrounding yellowish fluid in the right paracolic gutter. She had uterine fibroids.   Estimated Blood Loss:  Minimal  Drains: none         Specimens: appendix         Complications:  None; patient tolerated  the procedure well.         Disposition: PACU - hemodynamically stable.         Condition: stable  Leighton Ruff. Redmond Pulling, MD, FACS General, Bariatric, & Minimally Invasive Surgery [facility], [place]

## 2017-01-25 NOTE — ED Notes (Signed)
PT has been NPO since 10pm last night

## 2017-01-25 NOTE — Anesthesia Preprocedure Evaluation (Addendum)
Anesthesia Evaluation  Patient identified by MRN, date of birth, ID band Patient awake    Reviewed: Allergy & Precautions, NPO status , Patient's Chart, lab work & pertinent test results  Airway Mallampati: II  TM Distance: >3 FB Neck ROM: Full    Dental no notable dental hx.    Pulmonary neg pulmonary ROS,    Pulmonary exam normal breath sounds clear to auscultation       Cardiovascular negative cardio ROS Normal cardiovascular exam Rhythm:Regular Rate:Normal     Neuro/Psych negative neurological ROS  negative psych ROS   GI/Hepatic negative GI ROS, Neg liver ROS,   Endo/Other  negative endocrine ROS  Renal/GU negative Renal ROS  negative genitourinary   Musculoskeletal negative musculoskeletal ROS (+)   Abdominal   Peds negative pediatric ROS (+)  Hematology negative hematology ROS (+)   Anesthesia Other Findings   Reproductive/Obstetrics negative OB ROS                             Anesthesia Physical Anesthesia Plan  ASA: II  Anesthesia Plan: General   Post-op Pain Management:    Induction: Intravenous  PONV Risk Score and Plan: 2 and Ondansetron and Dexamethasone  Airway Management Planned: Oral ETT  Additional Equipment:   Intra-op Plan:   Post-operative Plan: Extubation in OR  Informed Consent: I have reviewed the patients History and Physical, chart, labs and discussed the procedure including the risks, benefits and alternatives for the proposed anesthesia with the patient or authorized representative who has indicated his/her understanding and acceptance.   Dental advisory given  Plan Discussed with: CRNA and Surgeon  Anesthesia Plan Comments:         Anesthesia Quick Evaluation  

## 2017-01-25 NOTE — ED Notes (Signed)
Heath Lark, RN on short stay accepts report for [facility]

## 2017-01-25 NOTE — ED Provider Notes (Signed)
[facility] DEPT Provider Note   CSN: [id number] Arrival date & time: 01/25/17  0905     History   Chief Complaint Chief Complaint  Patient presents with  . RLQ pain/nausea    HPI Audrey Jackson is a 41 y.o. female.  The history is provided by the patient. No language interpreter was used.   Audrey Jackson is a 41 y.o. female who presents to the Emergency Department complaining of abdominal pain.  Over the last 2 days she has experienced progressive right lower quadrant abdominal pain. She has a constant component to the pain with occasional sharp waves that radiates throughout her abdomen and back. She endorses associated nausea and decreased appetite. No fevers, vomiting, diarrhea, dysuria, vaginal discharge. She states that her urine is slightly darker than usual. No prior similar symptoms. No prior abdominal surgeries. Past Medical History:  Diagnosis Date  . Allergy   . Asthma   . History of migraine   . Hx of migraines   . STD (sexually transmitted disease) 20 years ago   [place]    Patient Active Problem List   Diagnosis Date Noted  . Appendicitis 01/25/2017  . Anxiety and depression 01/21/2017  . Fibroid uterus 09/15/2013  . History of bilateral breast reduction surgery 06/16/2013  . Migraine with aura 06/16/2013    Past Surgical History:  Procedure Laterality Date  . BREAST BIOPSY     x2  . BREAST REDUCTION SURGERY  2003    H to DD  . INTRAUTERINE DEVICE (IUD) INSERTION  11/27/2013   Mirena  . 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 LAPAROSCOPIC APPENDECTOMY N/A 01/25/2017   Procedure: APPENDECTOMY LAPAROSCOPIC;  Surgeon: Greer Pickerel, MD;  Location: [facility];  Service: General;  Laterality: N/A;    OB History    Gravida Para Term Preterm AB Living   2 2 2     2    SAB TAB Ectopic Multiple Live Births           2       Home Medications    Prior to Admission medications   Medication Sig Start Date End Date Taking? Authorizing Provider    ibuprofen (ADVIL,MOTRIN) 200 MG tablet Take 400 mg by mouth every 6 (six) hours as needed for mild pain.   Yes [provider]  levonorgestrel (MIRENA) 20 MCG/24HR IUD 1 each by Intrauterine route once. Inserted 11/17/13    [provider]  NONFORMULARY OR COMPOUNDED ITEM Boric acid suppository 200mg , size 0 gelatin capsule,1 pv twice weekly.  With flare use daily pv for 3 days. Patient taking differently: Place 1 suppository vaginally See admin instructions. Boric acid suppository 200mg , size 0 gelatin capsule,1 pv twice weekly.  With flare use daily pv for 3 days. 09/04/16   [name], FNP  oxyCODONE (OXY IR/ROXICODONE) 5 MG immediate release tablet Take 1-2 tablets (5-10 mg total) by mouth every 6 (six) hours as needed for moderate pain. 01/26/17   Izora Gala A, PA-C  sertraline (ZOLOFT) 50 MG tablet Take 1 tablet (50 mg total) by mouth daily. Patient not taking: Reported on 01/25/2017 01/21/17   Delano Metz, FNP    Family History Family History  Problem Relation Age of Onset  . Alcohol abuse Mother   . Alcohol abuse Father   . Drug abuse Father   . Depression Father   . Bipolar disorder Father   . Diabetes Father   . Breast cancer Maternal Grandmother     Social History  Social History  Substance Use Topics  . Smoking status: Never Smoker  . Smokeless tobacco: Never Used  . Alcohol use 0.6 - 1.2 oz/week    1 - 2 Standard drinks or equivalent per week     Allergies   Amoxil [amoxicillin]; Banana; Kiwi extract; and Soy allergy   Review of Systems Review of Systems  All other systems reviewed and are negative.    Physical Exam Updated Vital Signs BP 109/68 (BP Location: Right Arm)   Pulse 65   Temp 98.3 F (36.8 C) (Oral)   Resp 18   Ht 5\' 6"  (1.676 m)   Wt 99.6 kg (219 lb 9.6 oz)   SpO2 96%   BMI 35.44 kg/m   Physical Exam  Constitutional: She is oriented to person, place, and time. She appears well-developed and well-nourished.   HENT:  Head: Normocephalic and atraumatic.  Cardiovascular: Normal rate and regular rhythm.   No murmur heard. Pulmonary/Chest: Effort normal and breath sounds normal. No respiratory distress.  Abdominal: Soft. There is no rebound and no guarding.  Moderate RLQ tenderness with voluntary guarding, no rebound  Musculoskeletal: She exhibits no edema or tenderness.  Neurological: She is alert and oriented to person, place, and time.  Skin: Skin is warm and dry.  Psychiatric: She has a normal mood and affect. Her behavior is normal.  Nursing note and vitals reviewed.    ED Treatments / Results  Labs (all labs ordered are listed, but only abnormal results are displayed) Labs Reviewed  COMPREHENSIVE METABOLIC PANEL - Abnormal; Notable for the following:       Result Value   Glucose, Bld 104 (*)    BUN 5 (*)    ALT 13 (*)    All other components within normal limits  LIPASE, BLOOD  CBC  URINALYSIS, ROUTINE W REFLEX MICROSCOPIC  HIV ANTIBODY (ROUTINE TESTING)  I-STAT BETA HCG BLOOD, ED ([facility], [facility], [facility] ONLY)  SURGICAL PATHOLOGY    EKG  EKG Interpretation None       Radiology Ct Abdomen Pelvis W Contrast  Result Date: 01/25/2017 CLINICAL DATA:  Acute right lower quadrant abdominal pain. EXAM: CT ABDOMEN AND PELVIS WITH CONTRAST TECHNIQUE: Multidetector CT imaging of the abdomen and pelvis was performed using the standard protocol following bolus administration of intravenous contrast. CONTRAST:  129mL ISOVUE-300 IOPAMIDOL (ISOVUE-300) INJECTION 61% COMPARISON:  None. FINDINGS: Lower chest: No acute abnormality. Hepatobiliary: No focal liver abnormality is seen. No gallstones, gallbladder wall thickening, or biliary dilatation. Pancreas: Unremarkable. No pancreatic ductal dilatation or surrounding inflammatory changes. Spleen: Normal in size without focal abnormality. Adrenals/Urinary Tract: Adrenal glands are unremarkable. Kidneys are normal, without renal calculi, focal lesion, or  hydronephrosis. Bladder is unremarkable. Stomach/Bowel: There is no evidence of bowel obstruction. The appendix is enlarged and inflamed consistent with appendicitis. It is retrocecal in position. Vascular/Lymphatic: No significant vascular findings are present. No enlarged abdominal or pelvic lymph nodes. Reproductive: Intrauterine device is noted. Multiple small fibroids are noted. No adnexal abnormality is noted. Other: No abdominal wall hernia or abnormality. No abdominopelvic ascites. Musculoskeletal: No acute or significant osseous findings. IMPRESSION: The appendix is enlarged and inflamed consistent with acute appendicitis. It is retrocecal in position. No abscess is noted. Electronically Signed   By: Marijo Conception, M.D.   On: 01/25/2017 12:15    Procedures Procedures (including critical care time)  Medications Ordered in ED Medications  0.9 %  sodium chloride infusion ( Intravenous New Bag/Given 01/25/17 2108)  acetaminophen (TYLENOL) tablet 650  mg (650 mg Oral Given 01/26/17 0237)    Or  acetaminophen (TYLENOL) suppository 650 mg ( Rectal See Alternative 01/26/17 0237)  diphenhydrAMINE (BENADRYL) capsule 25 mg ( Oral MAR Unhold 01/25/17 2049)    Or  diphenhydrAMINE (BENADRYL) injection 25 mg ( Intravenous MAR Unhold 01/25/17 2049)  ondansetron (ZOFRAN-ODT) disintegrating tablet 4 mg ( Oral MAR Unhold 01/25/17 2049)    Or  ondansetron (ZOFRAN) injection 4 mg ( Intravenous MAR Unhold 01/25/17 2049)  pantoprazole (PROTONIX) injection 40 mg (40 mg Intravenous Given 01/25/17 2306)  promethazine (PHENERGAN) injection 12.5 mg (not administered)  oxyCODONE (Oxy IR/ROXICODONE) immediate release tablet 5-10 mg (10 mg Oral Given 01/26/17 0700)  methocarbamol (ROBAXIN) tablet 500 mg (500 mg Oral Given 01/26/17 0700)  enoxaparin (LOVENOX) injection 40 mg (not administered)  pneumococcal 23 valent vaccine (PNU-IMMUNE) injection 0.5 mL (not administered)  ibuprofen (ADVIL,MOTRIN) tablet 600 mg (not  administered)  morphine 4 MG/ML injection 1-4 mg (not administered)  sodium chloride 0.9 % bolus 1,000 mL (0 mLs Intravenous Stopped 01/25/17 1140)  morphine 4 MG/ML injection 4 mg (4 mg Intravenous Given 01/25/17 1040)  ondansetron (ZOFRAN) injection 4 mg (4 mg Intravenous Given 01/25/17 1040)  iopamidol (ISOVUE-300) 61 % injection (100 mLs  Contrast Given 01/25/17 1146)  cefTRIAXone (ROCEPHIN) 2 g in dextrose 5 % 50 mL IVPB (0 g Intravenous Stopped 01/25/17 1511)    And  metroNIDAZOLE (FLAGYL) IVPB 500 mg (0 mg Intravenous Stopped 01/25/17 1421)  sodium chloride 0.9 % bolus 1,000 mL (0 mLs Intravenous Stopped 01/25/17 1420)  morphine 4 MG/ML injection 4 mg (4 mg Intravenous Given 01/25/17 1320)     Initial Impression / Assessment and Plan / ED Course  I have reviewed the triage vital signs and the nursing notes.  Pertinent labs & imaging results that were available during my care of the patient were reviewed by me and considered in my medical decision making (see chart for details).     Patient here for evaluation of right lower quadrant abdominal pain. She has significant tenderness on examination. CT abdomen obtained and is consistent with acute appendicitis. Gen. surgery consultation for further management. She was provided with IV fluids, pain medications, antibiotics. Patient updated of findings of studies and recommendation for admission and she is in agreement with plan.  Final Clinical Impressions(s) / ED Diagnoses   Final diagnoses:  Acute appendicitis with localized peritonitis    New Prescriptions Current Discharge Medication List    START taking these medications   Details  oxyCODONE (OXY IR/ROXICODONE) 5 MG immediate release tablet Take 1-2 tablets (5-10 mg total) by mouth every 6 (six) hours as needed for moderate pain. Qty: 20 tablet, Refills: 0         Quintella Reichert, MD 01/26/17 [phone]

## 2017-01-25 NOTE — Anesthesia Procedure Notes (Signed)
Procedure Name: Intubation Date/Time: 01/25/2017 6:36 PM Performed by: Eligha Bridegroom Pre-anesthesia Checklist: Patient identified, Emergency Drugs available, Suction available, Patient being monitored and Timeout performed Patient Re-evaluated:Patient Re-evaluated prior to inductionOxygen Delivery Method: Circle system utilized Preoxygenation: Pre-oxygenation with 100% oxygen Intubation Type: IV induction and Rapid sequence Laryngoscope Size: Mac and 4 Grade View: Grade I Tube type: Oral Tube size: 7.0 mm Number of attempts: 1 Airway Equipment and Method: Stylet Placement Confirmation: ETT inserted through vocal cords under direct vision,  positive ETCO2 and breath sounds checked- equal and bilateral Secured at: 22 cm Tube secured with: Tape Dental Injury: Teeth and Oropharynx as per pre-operative assessment

## 2017-01-25 NOTE — ED Notes (Signed)
Got patient undress patient is waiting on provider

## 2017-01-25 NOTE — Transfer of Care (Signed)
Immediate Anesthesia Transfer of Care Note  Patient: Audrey Jackson  Procedure(s) Performed: Procedure(s): APPENDECTOMY LAPAROSCOPIC (N/A)  Patient Location: PACU  Anesthesia Type:General  Level of Consciousness: awake, alert  and oriented  Airway & Oxygen Therapy: Patient Spontanous Breathing and Patient connected to nasal cannula oxygen  Post-op Assessment: Report given to RN and Post -op Vital signs reviewed and stable  Post vital signs: Reviewed and stable  Last Vitals:  Vitals:   01/25/17 1615 01/25/17 1715  BP: 119/77 119/86  Pulse: 60 60  Resp: 18 16  Temp:      Last Pain:  Vitals:   01/25/17 1609  TempSrc: Oral  PainSc:          Complications: No apparent anesthesia complications

## 2017-01-25 NOTE — Anesthesia Postprocedure Evaluation (Signed)
Anesthesia Post Note  Patient: Audrey Jackson  Procedure(s) Performed: Procedure(s) (LRB): APPENDECTOMY LAPAROSCOPIC (N/A)     Patient location during evaluation: PACU Anesthesia Type: General Level of consciousness: awake and alert Pain management: pain level controlled Vital Signs Assessment: post-procedure vital signs reviewed and stable Respiratory status: spontaneous breathing, nonlabored ventilation, respiratory function stable and patient connected to nasal cannula oxygen Cardiovascular status: blood pressure returned to baseline and stable Postop Assessment: no signs of nausea or vomiting Anesthetic complications: no    Last Vitals:  Vitals:   01/25/17 2023 01/25/17 2040  BP: 110/71   Pulse: (!) 49   Resp: 17   Temp:  36.2 C    Last Pain:  Vitals:   01/25/17 2040  TempSrc:   PainSc: 4                  Tiajuana Amass

## 2017-01-26 ENCOUNTER — Encounter (HOSPITAL_COMMUNITY): Payer: Self-pay | Admitting: General Surgery

## 2017-01-26 LAB — HIV ANTIBODY (ROUTINE TESTING W REFLEX): HIV Screen 4th Generation wRfx: NONREACTIVE

## 2017-01-26 MED ORDER — IBUPROFEN 600 MG PO TABS: 600.0000 mg | ORAL_TABLET | Freq: Four times a day (QID) | ORAL | Status: DC | PRN

## 2017-01-26 MED ORDER — PNEUMOCOCCAL VAC POLYVALENT 25 MCG/0.5ML IJ INJ: 0.5000 mL | INJECTION | INTRAMUSCULAR | Status: DC

## 2017-01-26 MED ORDER — OXYCODONE HCL 5 MG PO TABS: 5.0000 mg | ORAL_TABLET | Freq: Four times a day (QID) | ORAL | 0 refills | Status: DC | PRN

## 2017-01-26 MED ORDER — MORPHINE SULFATE (PF) 4 MG/ML IV SOLN
1.0000 mg | INTRAVENOUS | Status: DC | PRN
  Administered 2017-01-26: 2 mg via INTRAVENOUS
  Filled 2017-01-26: qty 1

## 2017-01-26 NOTE — Progress Notes (Signed)
Pt complaining of SOB;asking for albuterol inhaler. She says she used one about 5 years ago for asthma. Pt in NAD. Respirations 18,not labored;O2 sat 96% RA; no wheezing. Encouraged pt to use her I.S and medicated her for pain as she says it hurts to take a deep breath. MD paged.

## 2017-01-26 NOTE — Discharge Instructions (Signed)

## 2017-01-26 NOTE — Progress Notes (Signed)
Pt now states that she feels her breathing is better since she received Oxycodone and Tylenol at 0237. She thinks maybe her abdominal pain was making it hard for her to take a deep breath. I told her to let me know if she felt she was having difficulty breathing. She stated she would.

## 2017-01-26 NOTE — Discharge Planning (Signed)
Patient discharged home in stable condition. Verbalizes understanding of all discharge instructions, including home medications and follow up appointments. 

## 2017-01-26 NOTE — Discharge Summary (Signed)
[facility] Discharge Summary   Patient ID: Audrey Jackson MRN: [id number] DOB/AGE: August 15, 1975 41 y.o.  Admit date: 01/25/2017 Discharge date: 01/26/2017  Admitting Diagnosis: Acute appendicitis  Discharge Diagnosis Patient Active Problem List   Diagnosis Date Noted  . Appendicitis 01/25/2017  . Anxiety and depression 01/21/2017  . Fibroid uterus 09/15/2013  . History of bilateral breast reduction surgery 06/16/2013  . Migraine with aura 06/16/2013    Consultants None  Imaging: Ct Abdomen Pelvis W Contrast  Result Date: 01/25/2017 CLINICAL DATA:  Acute right lower quadrant abdominal pain. EXAM: CT ABDOMEN AND PELVIS WITH CONTRAST TECHNIQUE: Multidetector CT imaging of the abdomen and pelvis was performed using the standard protocol following bolus administration of intravenous contrast. CONTRAST:  182mL ISOVUE-300 IOPAMIDOL (ISOVUE-300) INJECTION 61% COMPARISON:  None. FINDINGS: Lower chest: No acute abnormality. Hepatobiliary: No focal liver abnormality is seen. No gallstones, gallbladder wall thickening, or biliary dilatation. Pancreas: Unremarkable. No pancreatic ductal dilatation or surrounding inflammatory changes. Spleen: Normal in size without focal abnormality. Adrenals/Urinary Tract: Adrenal glands are unremarkable. Kidneys are normal, without renal calculi, focal lesion, or hydronephrosis. Bladder is unremarkable. Stomach/Bowel: There is no evidence of bowel obstruction. The appendix is enlarged and inflamed consistent with appendicitis. It is retrocecal in position. Vascular/Lymphatic: No significant vascular findings are present. No enlarged abdominal or pelvic lymph nodes. Reproductive: Intrauterine device is noted. Multiple small fibroids are noted. No adnexal abnormality is noted. Other: No abdominal wall hernia or abnormality. No abdominopelvic ascites. Musculoskeletal: No acute or significant osseous findings. IMPRESSION: The appendix is enlarged and  inflamed consistent with acute appendicitis. It is retrocecal in position. No abscess is noted. Electronically Signed   By: Marijo Conception, M.D.   On: 01/25/2017 12:15    Procedures Dr. Redmond Pulling (01/25/17) - Laparoscopic Appendectomy  Hospital Course:  Audrey Jackson is a 41yo female who presented to [facility] 01/25/17 with 2 days of progressive abdominal pain.  Workup showed acute appendicitis.  Patient was admitted and underwent procedure listed above.  Tolerated procedure well and was transferred to the floor.  Diet was advanced as tolerated.  On POD1 the patient was voiding well, tolerating diet, ambulating well, pain well controlled, vital signs stable, incisions c/d/i and felt stable for discharge home.  Patient will follow up in our office in 2-3 weeks and knows to call with questions or concerns.  She will call to confirm appointment date/time.    I have personally reviewed the patients medication history on the [place] controlled substance database.    Physical Exam: General:  Alert, NAD, pleasant, comfortable Pulm: effort normal, CTAB Cardio: RRR Abd:  Soft, ND, appropriately tender, multiple lap incisions C/D/I  Allergies as of 01/26/2017      Reactions   Amoxil [amoxicillin] Diarrhea, Nausea And Vomiting   Has patient had a PCN reaction causing immediate rash, facial/tongue/throat swelling, SOB or lightheadedness with hypotension: Yes Has patient had a PCN reaction causing severe rash involving mucus membranes or skin necrosis: No Has patient had a PCN reaction that required hospitalization: No Has patient had a PCN reaction occurring within the last 10 years: No If all of the above answers are "NO", then may proceed with Cephalosporin use. Severe diarrhea & vomiting   Banana Other (See Comments)   Shortness of Breath   Kiwi Extract Other (See Comments)   SOB   Soy Allergy Itching      Medication List    TAKE these medications   ibuprofen 200 MG tablet Commonly known as:  ADVIL,MOTRIN Take 400 mg by mouth every 6 (six) hours as needed for mild pain.   levonorgestrel 20 MCG/24HR IUD Commonly known as:  MIRENA 1 each by Intrauterine route once. Inserted 11/17/13   NONFORMULARY OR COMPOUNDED ITEM Boric acid suppository 200mg , size 0 gelatin capsule,1 pv twice weekly.  With flare use daily pv for 3 days. What changed:  how much to take  how to take this  when to take this  additional instructions   oxyCODONE 5 MG immediate release tablet Commonly known as:  Oxy IR/ROXICODONE Take 1-2 tablets (5-10 mg total) by mouth every 6 (six) hours as needed for moderate pain.   sertraline 50 MG tablet Commonly known as:  ZOLOFT Take 1 tablet (50 mg total) by mouth daily.        Follow-up [facility], [place]. Call in 3 week(s).   Specialty:  General Surgery Why:  We are working on your appointment. Please call to confirm. Contact information: [address] [place] [phone]          Signed: Jerrye Beavers, [facility] 01/26/2017, 8:59 AM Pager: [phone] Consults: [phone] Mon-Fri 7:00 am-4:30 pm Sat-Sun 7:00 am-11:30 am

## 2017-03-04 ENCOUNTER — Telehealth: Payer: Self-pay | Admitting: Family Medicine

## 2017-03-04 NOTE — Telephone Encounter (Signed)
Patient has been seen one time to establish care and evaluation for depression and initiation of sertraline occurred at that time. She was advised to follow up in one month for evaluation of symptoms which is due. Recommend office visit to address symptoms and treatment options.

## 2017-03-04 NOTE — Telephone Encounter (Signed)
Pt called the office stated that she will call back and schedule an appointment.

## 2017-03-04 NOTE — Telephone Encounter (Signed)
Pt states the  sertraline (ZOLOFT) 50 MG tablet And is not working and is not getting any relief.    Pt would like like to know what can be done.  Pt declined appt for Monday with Gregary Signs.  Pt has CPE appt 8/16 which need to be cancelled.   Please advise.   [id number] - [place], [place]

## 2017-03-04 NOTE — Telephone Encounter (Signed)
Attempted to call pt on the phone numbers provided, mailbox is full and was not successful leaving a message.

## 2017-03-16 NOTE — Telephone Encounter (Signed)
Patient has an appointment for CPE scheduled for 03/25/2017 at 9 am.

## 2017-03-25 ENCOUNTER — Encounter: Payer: BC Managed Care – PPO | Admitting: Family Medicine

## 2017-03-25 ENCOUNTER — Ambulatory Visit: Payer: BC Managed Care – PPO | Admitting: Family Medicine

## 2017-04-02 ENCOUNTER — Encounter: Payer: BC Managed Care – PPO | Admitting: Family Medicine

## 2017-04-09 ENCOUNTER — Ambulatory Visit (INDEPENDENT_AMBULATORY_CARE_PROVIDER_SITE_OTHER): Payer: BC Managed Care – PPO | Admitting: Family Medicine

## 2017-04-09 ENCOUNTER — Encounter: Payer: Self-pay | Admitting: Family Medicine

## 2017-04-09 VITALS — BP 106/70 | Temp 98.5°F | Ht 66.25 in | Wt 215.0 lb

## 2017-04-09 DIAGNOSIS — Z13220 Encounter for screening for lipoid disorders: Secondary | ICD-10-CM | POA: Diagnosis not present

## 2017-04-09 DIAGNOSIS — F419 Anxiety disorder, unspecified: Secondary | ICD-10-CM

## 2017-04-09 DIAGNOSIS — Z0001 Encounter for general adult medical examination with abnormal findings: Secondary | ICD-10-CM

## 2017-04-09 DIAGNOSIS — K5900 Constipation, unspecified: Secondary | ICD-10-CM

## 2017-04-09 DIAGNOSIS — F329 Major depressive disorder, single episode, unspecified: Secondary | ICD-10-CM

## 2017-04-09 DIAGNOSIS — Z23 Encounter for immunization: Secondary | ICD-10-CM | POA: Diagnosis not present

## 2017-04-09 LAB — LIPID PANEL
CHOL/HDL RATIO: 5
Cholesterol: 187 mg/dL (ref 0–200)
HDL: 39.5 mg/dL (ref >39.00)
LDL CALC: 130 mg/dL — AB (ref 0–99)
NONHDL: 147.86
Triglycerides: 89 mg/dL (ref 0.0–149.0)
VLDL: 17.8 mg/dL (ref 0.0–40.0)

## 2017-04-09 LAB — POCT URINALYSIS DIPSTICK
BILIRUBIN UA: NEGATIVE
Clarity, UA: NEGATIVE
Glucose, UA: NEGATIVE
Ketones, UA: NEGATIVE
LEUKOCYTES UA: NEGATIVE
Nitrite, UA: NEGATIVE
PH UA: 7 (ref 5.0–8.0)
Protein, UA: NEGATIVE
RBC UA: NEGATIVE
Spec Grav, UA: 1.02 (ref 1.010–1.025)
Urobilinogen, UA: 0.2 E.U./dL

## 2017-04-09 LAB — CBC WITH DIFFERENTIAL/PLATELET
BASOS ABS: 0 10*3/uL (ref 0.0–0.1)
BASOS PCT: 0.4 % (ref 0.0–3.0)
Eosinophils Absolute: 0.2 10*3/uL (ref 0.0–0.7)
Eosinophils Relative: 2.3 % (ref 0.0–5.0)
HCT: 42.8 % (ref 36.0–46.0)
Hemoglobin: 14 g/dL (ref 12.0–15.0)
LYMPHS ABS: 1.7 10*3/uL (ref 0.7–4.0)
Lymphocytes Relative: 22.6 % (ref 12.0–46.0)
MCHC: 32.7 g/dL (ref 30.0–36.0)
MCV: 85.9 fl (ref 78.0–100.0)
MONOS PCT: 7.9 % (ref 3.0–12.0)
Monocytes Absolute: 0.6 10*3/uL (ref 0.1–1.0)
NEUTROS ABS: 5 10*3/uL (ref 1.4–7.7)
NEUTROS PCT: 66.8 % (ref 43.0–77.0)
PLATELETS: 296 10*3/uL (ref 150.0–400.0)
RBC: 4.98 Mil/uL (ref 3.87–5.11)
RDW: 13.4 % (ref 11.5–15.5)
WBC: 7.4 10*3/uL (ref 4.0–10.5)

## 2017-04-09 LAB — BASIC METABOLIC PANEL
BUN: 9 mg/dL (ref 6–23)
CHLORIDE: 106 meq/L (ref 96–112)
CO2: 26 mEq/L (ref 19–32)
Calcium: 9.5 mg/dL (ref 8.4–10.5)
Creatinine, Ser: 0.76 mg/dL (ref 0.40–1.20)
GFR: 108.33 mL/min (ref >60.00)
Glucose, Bld: 84 mg/dL (ref 70–99)
Potassium: 5 mEq/L (ref 3.5–5.1)
Sodium: 139 mEq/L (ref 135–145)

## 2017-04-09 LAB — HEPATIC FUNCTION PANEL
ALK PHOS: 54 U/L (ref 39–117)
ALT: 12 U/L (ref 0–35)
AST: 12 U/L (ref 0–37)
Albumin: 4.1 g/dL (ref 3.5–5.2)
BILIRUBIN DIRECT: 0.1 mg/dL (ref 0.0–0.3)
BILIRUBIN TOTAL: 0.6 mg/dL (ref 0.2–1.2)
Total Protein: 7 g/dL (ref 6.0–8.3)

## 2017-04-09 LAB — TSH: TSH: 1.25 u[IU]/mL (ref 0.35–4.50)

## 2017-04-09 MED ORDER — SERTRALINE HCL 100 MG PO TABS: 100.0000 mg | ORAL_TABLET | Freq: Every day | ORAL | 3 refills | Status: DC

## 2017-04-09 NOTE — Progress Notes (Signed)
Subjective:    Patient ID: Audrey Jackson, female    DOB: Dec 10, 1975, 41 y.o.   MRN: [id number]  HPI  Audrey Jackson is a 41 year old female who presents today for a routine physical exam. She has never been a smoker.   Reviewed health maintenance protocols including mammography, colonoscopy, bone density, and reviewed appropriate screening labs.  Her immunization history was reviewed as well as her current medications and allergies. Refills of her chronic medications were given and the plan for yearly health maintenance was discussed.  All orders and referrals were made as appropriate.  Depression has improved per patient. She reports that she will have "some bouts of sadness" but these are less. PHQ-9 today is a 3 which indicates improvement from previous visit. She denies suicidal/homicidal ideation. She has benefited from counseling. Symptom of feeling "tired" is noted on several days/week. She denies appetite changes.   Constipation with BMs occurring twice weekly.  She denies melena, hematochezia, nausea/vomiting, or thin caliber stools. She reports that this has been a long standing problem and notes that this increases with stress.   Pap is UTD and and she is followed by women's health. Next appointment 01/19.  Mammogram will be due at next appointment 1/19.  She will follow up with women's health for this screening.  Diet:  She does not follow a particular diet.  She eats fried foods once weekly but avoids processed foods. She states that she does not drink "enough water" and she drinks 2 cups of coffee/day.    Exercise:  She walks every day for approximately 20 minutes/day. She plans to increase this activity.  She denies any cardiopulmonary symptoms with exercise.   She reports "doing well" after appendectomy 01/25/17.  Review of Systems Constitutional: No fever, chills, significant weight change, fatigue, weakness or night sweats Eyes: No redness, discharge, pain, blurred vision,  double vision, or loss of vision ENT/mouth: No nasal congestion, postnasal drainage,epistaxis, purulent discharge, earache, hearing loss, tinnitus ,sore throat , dental pain, or hoarseness   Cardiovascular: no chest pain, palpitations, racing, irregular rhythm, syncope, nausea, sweating, claudication, or edema  Respiratory: No cough, sputum production,hemoptysis,  dyspnea, paroxysmal nocturnal dyspnea, pleuritic chest pain, significant snoring, or  apnea    Gastrointestinal: No heartburn,dysphagia, nausea and vomiting,abdominal pain, change in bowels, anorexia, diarrhea, significant constipation, rectal bleeding, melena,  stool incontinence or jaundice Genitourinary: No dysuria,hematuria, pyuria, frequency, urgency,  incontinence, nocturia, dark urine or flank pain Musculoskeletal: No myalgias or muscle cramping, joint stiffness, joint swelling, joint color change, weakness, or cyanosis Dermatologic: No rash, pruritus, urticaria, or change in color or temperature of skin Neurologic: No headache, vertigo, limb weakness, tremor, gait disturbance, seizures, memory loss, numbness or tingling Psychiatric: No significant anxiety; depression present, denies anhedonia, panic attacks, insomnia, or anorexia Endocrine: No change in hair/skin/ nails, excessive thirst, excessive hunger, excessive urination, fatigue is reported on several days/week Hematologic/lymphatic: No bruising, lymphadenopathy,or  abnormal clotting Allergy/immunology: No itchy/ watery eyes, abnormal sneezing, rhinitis, urticaria ,or angioedema  Past Medical History:  Diagnosis Date  . Allergy   . Anxiety   . Asthma   . Depression   . Headache   . History of migraine   . Hx of migraines   . STD (sexually transmitted disease) 20 years ago   [place] History  . Marital status: Divorced    Spouse name: N/A  . Number of children: N/A  . Years of education: N/A  Occupational History  . Not on file.     Social History Main Topics  . Smoking status: Never Smoker  . Smokeless tobacco: Never Used  . Alcohol use 0.6 - 1.2 oz/week    1 - 2 Standard drinks or equivalent per week  . Drug use: No  . Sexual activity: Yes    Partners: Male    Birth control/ protection: Condom, IUD   Other Topics Concern  . Not on file   Social History Narrative  . No narrative on file    Past Surgical History:  Procedure Laterality Date  . BREAST BIOPSY     x2  . BREAST REDUCTION SURGERY  2003    H to DD  . INTRAUTERINE DEVICE (IUD) INSERTION  11/27/2013   Mirena  . 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 LAPAROSCOPIC APPENDECTOMY N/A 01/25/2017   Procedure: APPENDECTOMY LAPAROSCOPIC;  Surgeon: Greer Pickerel, MD;  Location: [facility] OR;  Service: General;  Laterality: N/A;    Family History  Problem Relation Age of Onset  . Alcohol abuse Mother   . Alcohol abuse Father   . Drug abuse Father   . Depression Father   . Bipolar disorder Father   . Diabetes Father   . Breast cancer Maternal Grandmother     Allergies  Allergen Reactions  . Amoxil [Amoxicillin] Diarrhea and Nausea And Vomiting    Has patient had a PCN reaction causing immediate rash, facial/tongue/throat swelling, SOB or lightheadedness with hypotension: Yes Has patient had a PCN reaction causing severe rash involving mucus membranes or skin necrosis: No Has patient had a PCN reaction that required hospitalization: No Has patient had a PCN reaction occurring within the last 10 years: No If all of the above answers are "NO", then may proceed with Cephalosporin use.   Severe diarrhea & vomiting  . Banana Other (See Comments)    Shortness of Breath  . Kiwi Extract Other (See Comments)    SOB  . Soy Allergy Itching    Current Outpatient Prescriptions on File Prior to Visit  Medication Sig Dispense Refill  . levonorgestrel (MIRENA) 20 MCG/24HR IUD 1 each by Intrauterine route once. Inserted 11/17/13    . NONFORMULARY OR COMPOUNDED  ITEM Boric acid suppository 200mg , size 0 gelatin capsule,1 pv twice weekly.  With flare use daily pv for 3 days. (Patient taking differently: Place 1 suppository vaginally See admin instructions. Boric acid suppository 200mg , size 0 gelatin capsule,1 pv twice weekly.  With flare use daily pv for 3 days.) 30 each 12   No current facility-administered medications on file prior to visit.     BP 106/70 (BP Location: Left Arm)   Temp 98.5 F (36.9 C) (Oral)   Ht 5' 6.25" (1.683 m)   Wt 215 lb (97.5 kg)   BMI 34.44 kg/m       Objective:   Physical Exam  Physical Exam  Constitutional: She is oriented to person, place, and time. She appears well-developed and well-nourished. No distress.  HENT:  Head: Normocephalic and atraumatic.  Right Ear: Tympanic membrane and ear canal normal.  Left Ear: Tympanic membrane and ear canal normal.  Mouth/Throat: Oropharynx is clear and moist.  Eyes: Pupils are equal, round, and reactive to light. No scleral icterus.  Neck: Normal range of motion. No thyromegaly present.  Cardiovascular: Normal rate and regular rhythm.   No murmur heard. Pulmonary/Chest: Effort normal and breath sounds normal. No respiratory distress. He has no wheezes. She has  no rales. She exhibits no tenderness.  Abdominal: Soft. Bowel sounds are normal. She exhibits no distension and no mass. There is no tenderness. There is no rebound and no guarding.  Musculoskeletal: She exhibits no edema.  Lymphadenopathy:    She has no cervical adenopathy.  Neurological: She is alert and oriented to person, place, and time. She has normal patellar reflexes. She exhibits normal muscle tone. Coordination normal.  Skin: Skin is warm and dry.  Psychiatric: She has a normal mood and affect. Her behavior is normal. Judgment and thought content normal. She is smiling and actively engaged in conversation.      Assessment & Plan:  1. Encounter for general adult medical examination with abnormal  findings 41 y.o. female presenting for annual physical.  Health Maintenance counseling: 1. Anticipatory guidance: Patient counseled regarding regular dental exams, eye exams, wearing seatbelts.  2. Risk factor reduction:  Advised patient of need for regular exercise and diet rich and fruits and vegetables to reduce risk of heart attack and stroke.  3. Immunizations/screenings/ancillary studies Immunization History  Administered Date(s) Administered  . Influenza,inj,Quad PF,6+ Mos 04/30/2014  . Tdap 05/24/2009   Health Maintenance Due  Topic Date Due  . INFLUENZA VACCINE  03/10/2017   4. Cervical cancer screening- Followed by women's health 5. Breast cancer screening-  Followed by women's health 6. Colon cancer screening - Not due 7. Skin cancer screening- No suspicious lesions noted at this time; advised used of sunscreen and reviewed ABCDEs of skin assessment.   2. Anxiety and depression Depression has improved however symptoms remain present. We discussed increasing dose of sertraline and continuation of counseling. She denies adverse effects of sertraline and would like to proceed with increasing dose from 50 mg daily to 100 mg daily and follow up in 4 weeks for further evaluation. Will obtain lab work today to evaluate TSH and anemia due to symptom of fatigue that has been present.  - sertraline (ZOLOFT) 100 MG tablet; Take 1 tablet (100 mg total) by mouth daily.  Dispense: 30 tablet; Refill: 3 - CBC with Differential/Platelet - Basic metabolic panel - Hepatic function panel - TSH - POCT urinalysis dipstick  3. Constipation, unspecified constipation type Exam is reassuring;  Trial of  Increase fiber, water intake, exercise, and miralax OTC for symptom. Advised her to contact me if treatment plan does not benefit her.  4. Screening for cholesterol level Patient is adopted and has limited knowledge of biological parent history; will screen for cholesterol level today and advised  improvements in dietary choices; specifically avoidance of fried, fatty foods and also increase in lean meats and vegetables. She voices interest in improving dietary and exercise program. - Lipid panel  Follow up in 4 weeks for evaluation of depression and change in sertraline dose. Delano Metz, FNP-C

## 2017-04-09 NOTE — Patient Instructions (Signed)
It was a pleasure to see you today! We have ordered labs or studies at this visit. It can take up to 1-2 weeks for results and processing. IF results require follow up or explanation, we will call you with instructions. Clinically stable results will be released to your [facility]. If you have not heard from [place] or cannot find your results in [facility] in 2 weeks please contact our office at [phone].  If you are not yet signed up for [facility], please consider signing up   You can take 2 tablets of the 50 mg sertraline that you have until you have finished the prescription. After you have finished the medication, I have sent sertraline 100 mg daily for you.    Miralax can be used for constipation and can be purchased over the counter. Please increase your water intake; enough to keep your urine pale yellow or clear and also increase fiber and exercise.  Follow up in 4 to 6 weeks for evaluation of sertraline.   Constipation, Adult Constipation is when a person:  Poops (has a bowel movement) fewer times in a week than normal.  Has a hard time pooping.  Has poop that is dry, hard, or bigger than normal.  Follow these instructions at home: Eating and drinking   Eat foods that have a lot of fiber, such as: ? Fresh fruits and vegetables. ? Whole grains. ? Beans.  Eat less of foods that are high in fat, low in fiber, or overly processed, such as: ? [place] fries. ? Hamburgers. ? Cookies. ? Candy. ? Soda.  Drink enough fluid to keep your pee (urine) clear or pale yellow. General instructions  Exercise regularly or as told by your doctor.  Go to the restroom when you feel like you need to poop. Do not hold it in.  Take over-the-counter and prescription medicines only as told by your doctor. These include any fiber supplements.  Do pelvic floor retraining exercises, such as: ? Doing deep breathing while relaxing your lower belly (abdomen). ? Relaxing your pelvic floor while  pooping.  Watch your condition for any changes.  Keep all follow-up visits as told by your doctor. This is important. Contact a doctor if:  You have pain that gets worse.  You have a fever.  You have not pooped for 4 days.  You throw up (vomit).  You are not hungry.  You lose weight.  You are bleeding from the anus.  You have thin, pencil-like poop (stool). Get help right away if:  You have a fever, and your symptoms suddenly get worse.  You leak poop or have blood in your poop.  Your belly feels hard or bigger than normal (is bloated).  You have very bad belly pain.  You feel dizzy or you faint. This information is not intended to replace advice given to you by your health care provider. Make sure you discuss any questions you have with your health care provider. Document Released: 01/13/2008 Document Revised: 02/14/2016 Document Reviewed: 01/15/2016 Elsevier Interactive Patient Education  2017 [employer].

## 2017-04-29 ENCOUNTER — Encounter: Payer: Self-pay | Admitting: Family Medicine

## 2017-05-03 ENCOUNTER — Encounter: Payer: Self-pay | Admitting: Obstetrics and Gynecology

## 2017-05-03 ENCOUNTER — Ambulatory Visit: Payer: BC Managed Care – PPO | Admitting: Obstetrics and Gynecology

## 2017-05-03 ENCOUNTER — Ambulatory Visit (INDEPENDENT_AMBULATORY_CARE_PROVIDER_SITE_OTHER): Payer: BC Managed Care – PPO | Admitting: Obstetrics and Gynecology

## 2017-05-03 VITALS — BP 120/78 | HR 64 | Resp 14 | Wt 214.0 lb

## 2017-05-03 DIAGNOSIS — R350 Frequency of micturition: Secondary | ICD-10-CM | POA: Diagnosis not present

## 2017-05-03 DIAGNOSIS — R300 Dysuria: Secondary | ICD-10-CM | POA: Diagnosis not present

## 2017-05-03 DIAGNOSIS — R3915 Urgency of urination: Secondary | ICD-10-CM | POA: Diagnosis not present

## 2017-05-03 DIAGNOSIS — N3090 Cystitis, unspecified without hematuria: Secondary | ICD-10-CM

## 2017-05-03 LAB — POCT URINALYSIS DIPSTICK
BILIRUBIN UA: NEGATIVE
Glucose, UA: NEGATIVE
Ketones, UA: NEGATIVE
NITRITE UA: NEGATIVE
PH UA: 6.5 (ref 5.0–8.0)
PROTEIN UA: NEGATIVE
UROBILINOGEN UA: NEGATIVE U/dL — AB

## 2017-05-03 MED ORDER — PHENAZOPYRIDINE HCL 200 MG PO TABS: 200.0000 mg | ORAL_TABLET | Freq: Three times a day (TID) | ORAL | 0 refills | Status: DC | PRN

## 2017-05-03 MED ORDER — SULFAMETHOXAZOLE-TRIMETHOPRIM 800-160 MG PO TABS: 1.0000 | ORAL_TABLET | Freq: Two times a day (BID) | ORAL | 0 refills | Status: DC

## 2017-05-03 NOTE — Progress Notes (Signed)
GYNECOLOGY  VISIT   HPI: 41 y.o.   Divorced  [place]  female   [phone] with No LMP recorded. Patient is not currently having periods (Reason: IUD).   here c/o dysuria, urinary frequency, urinary urgency. Voiding small amounts. Symptoms started on Friday, haven't gotten better or worse. She has tried cranberry juice. Some suprapubic pain, no fevers or flank pain.     She is sexually active, tried a new condom brand.   GYNECOLOGIC HISTORY: No LMP recorded. Patient is not currently having periods (Reason: IUD). Contraception:IUD Menopausal hormone therapy: none         OB History    Gravida Para Term Preterm AB Living   2 2 2     2    SAB TAB Ectopic Multiple Live Births           2         Patient Active Problem List   Diagnosis Date Noted  . Appendicitis 01/25/2017  . Anxiety and depression 01/21/2017  . Fibroid uterus 09/15/2013  . History of bilateral breast reduction surgery 06/16/2013  . Migraine with aura 06/16/2013    Past Medical History:  Diagnosis Date  . Allergy   . Anxiety   . Asthma   . Depression   . Headache   . History of migraine   . Hx of migraines   . STD (sexually transmitted disease) 20 years ago   [place]    Past Surgical History:  Procedure Laterality Date  . APPENDECTOMY  01/25/2017  . BREAST BIOPSY     x2  . BREAST REDUCTION SURGERY  2003    H to DD  . INTRAUTERINE DEVICE (IUD) INSERTION  11/27/2013   Mirena  . 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 LAPAROSCOPIC APPENDECTOMY N/A 01/25/2017   Procedure: APPENDECTOMY LAPAROSCOPIC;  Surgeon: Greer Pickerel, MD;  Location: [facility] OR;  Service: General;  Laterality: N/A;    Current Outpatient Prescriptions  Medication Sig Dispense Refill  . levonorgestrel (MIRENA) 20 MCG/24HR IUD 1 each by Intrauterine route once. Inserted 11/17/13    . NONFORMULARY OR COMPOUNDED ITEM Boric acid suppository 200mg , size 0 gelatin capsule,1 pv twice weekly.  With flare use daily pv for 3 days. (Patient  taking differently: Place 1 suppository vaginally See admin instructions. Boric acid suppository 200mg , size 0 gelatin capsule,1 pv twice weekly.  With flare use daily pv for 3 days.) 30 each 12  . sertraline (ZOLOFT) 100 MG tablet Take 1 tablet (100 mg total) by mouth daily. 30 tablet 3  . valACYclovir (VALTREX) 1000 MG tablet      No current facility-administered medications for this visit.      ALLERGIES: Amoxil [amoxicillin]; Banana; Kiwi extract; and Soy allergy  Family History  Problem Relation Age of Onset  . Alcohol abuse Mother   . Alcohol abuse Father   . Drug abuse Father   . Depression Father   . Bipolar disorder Father   . Diabetes Father   . Breast cancer Maternal Grandmother     Social History   Social History  . Marital status: Divorced    Spouse name: N/A  . Number of children: N/A  . Years of education: N/A   Occupational History  . Not on file.   Social History Main Topics  . Smoking status: Never Smoker  . Smokeless tobacco: Never Used  . Alcohol use 0.6 - 1.2 oz/week    1 - 2 Standard drinks or equivalent per week  .  Drug use: No  . Sexual activity: Yes    Partners: Male    Birth control/ protection: Condom, IUD   Other Topics Concern  . Not on file   Social History Narrative  . No narrative on file    Review of Systems  Constitutional: Negative.   HENT: Negative.   Eyes: Negative.   Respiratory: Negative.   Cardiovascular: Negative.   Gastrointestinal: Negative.   Genitourinary: Positive for dysuria, frequency and urgency.  Musculoskeletal: Negative.   Skin: Negative.   Neurological: Negative.   Endo/Heme/Allergies: Negative.   Psychiatric/Behavioral: Negative.     PHYSICAL EXAMINATION:    BP 120/78 (BP Location: Right Arm, Patient Position: Sitting, Cuff Size: Large)   Pulse 64   Resp 14   Wt 214 lb (97.1 kg)   BMI 34.28 kg/m     General appearance: alert, cooperative and appears stated age Abdomen: soft, non-tender; non  distended, no masses,  no organomegaly CVA: not tender  UA: trace blood, 3+ leuk  ASSESSMENT Cystitis    PLAN Send urine for ua, c&s Treat with bactrim ds Pyridium for discomfort   An After Visit Summary was printed and given to the patient.

## 2017-05-03 NOTE — Patient Instructions (Signed)

## 2017-05-04 LAB — URINALYSIS, MICROSCOPIC ONLY: Casts: NONE SEEN /lpf

## 2017-05-05 ENCOUNTER — Telehealth: Payer: Self-pay | Admitting: *Deleted

## 2017-05-05 NOTE — Telephone Encounter (Signed)
Patient returned call to Elaine. °

## 2017-05-05 NOTE — Telephone Encounter (Signed)
-----   Message from Audrey Dom, MD sent at 05/05/2017 12:55 PM EDT ----- The patient is on Bactrim, please call and see if she is feeling better on the medication and let her know she does have a UTI, sensitivities are still pending

## 2017-05-05 NOTE — Telephone Encounter (Signed)
Spoke with patient and gave results. Patient is feeling some better. Advised that we would call her back when we received the final U/C results -eh

## 2017-05-05 NOTE — Telephone Encounter (Signed)
Left message to call back regarding results -eh  

## 2017-05-06 LAB — URINE CULTURE

## 2017-07-08 ENCOUNTER — Ambulatory Visit: Payer: BC Managed Care – PPO | Admitting: Family Medicine

## 2017-08-11 ENCOUNTER — Other Ambulatory Visit: Payer: Self-pay | Admitting: Certified Nurse Midwife

## 2017-08-11 ENCOUNTER — Encounter: Payer: Self-pay | Admitting: Certified Nurse Midwife

## 2017-08-11 ENCOUNTER — Other Ambulatory Visit (HOSPITAL_COMMUNITY)
Admission: RE | Admit: 2017-08-11 | Discharge: 2017-08-11 | Disposition: A | Discharge status: Home / Self Care | Payer: BC Managed Care – PPO | Source: Ambulatory Visit | Attending: Certified Nurse Midwife | Admitting: Certified Nurse Midwife

## 2017-08-11 ENCOUNTER — Ambulatory Visit (INDEPENDENT_AMBULATORY_CARE_PROVIDER_SITE_OTHER): Payer: BC Managed Care – PPO | Admitting: Certified Nurse Midwife

## 2017-08-11 ENCOUNTER — Other Ambulatory Visit: Payer: Self-pay

## 2017-08-11 VITALS — BP 110/68 | HR 64 | Resp 16 | Ht 65.75 in | Wt 220.0 lb

## 2017-08-11 DIAGNOSIS — Z8619 Personal history of other infectious and parasitic diseases: Secondary | ICD-10-CM | POA: Diagnosis not present

## 2017-08-11 DIAGNOSIS — Z01419 Encounter for gynecological examination (general) (routine) without abnormal findings: Secondary | ICD-10-CM

## 2017-08-11 DIAGNOSIS — E559 Vitamin D deficiency, unspecified: Secondary | ICD-10-CM | POA: Diagnosis not present

## 2017-08-11 DIAGNOSIS — Z0000 Encounter for general adult medical examination without abnormal findings: Secondary | ICD-10-CM

## 2017-08-11 DIAGNOSIS — Z124 Encounter for screening for malignant neoplasm of cervix: Secondary | ICD-10-CM | POA: Diagnosis not present

## 2017-08-11 DIAGNOSIS — Z139 Encounter for screening, unspecified: Secondary | ICD-10-CM

## 2017-08-11 LAB — POCT URINALYSIS DIPSTICK
Bilirubin, UA: NEGATIVE
Blood, UA: NEGATIVE
Glucose, UA: NEGATIVE
KETONES UA: NEGATIVE
Leukocytes, UA: NEGATIVE
NITRITE UA: NEGATIVE
PH UA: 5 (ref 5.0–8.0)
PROTEIN UA: NEGATIVE
Urobilinogen, UA: NEGATIVE E.U./dL — AB

## 2017-08-11 MED ORDER — VALACYCLOVIR HCL 1 G PO TABS: ORAL_TABLET | ORAL | 1 refills | Status: DC

## 2017-08-11 NOTE — Patient Instructions (Signed)

## 2017-08-11 NOTE — Progress Notes (Signed)
42 y.o. G70P2002 Divorced  biracial Fe here for annual exam. Periods none with Mirena IUD. Patient has noted LLQ pain in waves for the past 12 hours and wonders if ovulation. Has not had bowel movement in 3 days, this is normal for her.Had nausea with bowel movement. 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 Screening labs desired, she did not fast. She had elevation with last screening 2 years ago with PCP, who now has retired. Plans to establish for Wellbutrin management, which is working well. She has refills. No STD screening needed. No other issues today.  No LMP recorded. Patient is not currently having periods (Reason: IUD).          Sexually active: Yes.    The current method of family planning is IUD & condoms   Exercising: No.  exercise Smoker:  no  Health Maintenance: Pap:  07-31-15 neg HPV HR neg History of Abnormal Pap: yes MMG:  2013 birads 1:neg  Self Breast exams: yes Colonoscopy:  none BMD:   none TDaP:  2010 Shingles: no Pneumonia: no Hep C and HIV: Hep c neg 2016, HIV neg 2018 Labs: poct urine-neg   reports that  has never smoked. she has never used smokeless tobacco. She reports that she drinks about 0.6 - 1.2 oz of alcohol per week. She reports that she does not use drugs.  Past Medical History:  Diagnosis Date  . Allergy   . Anxiety   . Asthma   . Depression   . Headache   . History of migraine   . Hx of migraines   . STD (sexually transmitted disease) 20 years ago   [place]    Past Surgical History:  Procedure Laterality Date  . APPENDECTOMY  01/25/2017  . BREAST BIOPSY     x2  . BREAST REDUCTION SURGERY  2003    H to DD  . INTRAUTERINE DEVICE (IUD) INSERTION  11/27/2013   Mirena  . 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 LAPAROSCOPIC APPENDECTOMY N/A 01/25/2017   Procedure: APPENDECTOMY LAPAROSCOPIC;  Surgeon: Greer Pickerel, MD;  Location: [facility] OR;  Service: General;  Laterality: N/A;    Current Outpatient Medications  Medication  Sig Dispense Refill  . levonorgestrel (MIRENA) 20 MCG/24HR IUD 1 each by Intrauterine route once. Inserted 11/17/13    . NONFORMULARY OR COMPOUNDED ITEM Boric acid suppository 200mg , size 0 gelatin capsule,1 pv twice weekly.  With flare use daily pv for 3 days. (Patient taking differently: Place 1 suppository vaginally See admin instructions. Boric acid suppository 200mg , size 0 gelatin capsule,1 pv twice weekly.  With flare use daily pv for 3 days.) 30 each 12  . sertraline (ZOLOFT) 100 MG tablet Take 1 tablet (100 mg total) by mouth daily. 30 tablet 3  . valACYclovir (VALTREX) 1000 MG tablet      No current facility-administered medications for this visit.     Family History  Problem Relation Age of Onset  . Alcohol abuse Mother   . Alcohol abuse Father   . Drug abuse Father   . Depression Father   . Bipolar disorder Father   . Diabetes Father   . Breast cancer Maternal Grandmother     ROS:  Pertinent items are noted in HPI.  Otherwise, a comprehensive ROS was negative.  Exam:   BP 110/68   Pulse 64   Resp 16   Ht 5' 5.75" (1.67 m)   Wt 220 lb (99.8 kg)  BMI 35.78 kg/m  Height: 5' 5.75" (167 cm) Ht Readings from Last 3 Encounters:  08/11/17 5' 5.75" (1.67 m)  04/09/17 5' 6.25" (1.683 m)  01/25/17 5\' 6"  (1.676 m)    General appearance: alert, cooperative and appears stated age Head: Normocephalic, without obvious abnormality, atraumatic Neck: no adenopathy, supple, symmetrical, trachea midline and thyroid normal to inspection and palpation Lungs: clear to auscultation bilaterally Breasts: normal appearance, no masses or tenderness, No nipple retraction or dimpling, No nipple discharge or bleeding, No axillary or supraclavicular adenopathy, large pendulous bilateral Heart: regular rate and rhythm Abdomen: soft, non-tender; no masses,  no organomegaly Extremities: extremities normal, atraumatic, no cyanosis or edema Skin: Skin color, texture, turgor normal. No rashes or  lesions Lymph nodes: Cervical, supraclavicular, and axillary nodes normal. No abnormal inguinal nodes palpated Neurologic: Grossly normal   Pelvic: External genitalia:  no lesions              Urethra:  normal appearing urethra with no masses, tenderness or lesions              Bartholin's and Skene's: normal                 Vagina: normal appearing vagina with normal color and discharge, no lesions              Cervix: no cervical motion tenderness, no lesions and normal appearance with IUD string noted in cervix              Pap taken: Yes.   Bimanual Exam:  Uterus:  normal size, contour, position, consistency, mobility, non-tender              Adnexa: normal adnexa and no mass, fullness, tenderness               Rectovaginal: Confirms               Anus:  normal sphincter tone, no lesions  Chaperone present: yes  A:  Well Woman with normal exam  Contraception Mirena IUD due for removal 4/20   History of HSV 1(oral) needs Rx update  Depression management with PCP, will be establishing with new one, has Rx refills  Screening labs will scheduled fasting lab appointment  Mammogram due  P:   Reviewed health and wellness pertinent to exam  Warning signs of IUD reviewed and need to advise  Rx Valtrex see order with instructions  Patient will call if needs help with PCP establish  Labs: CMP,Lipid panel, TSH, Vitamin D,CBC  Given information to schedule  Pap smear: yes   counseled on breast self exam, mammography screening, STD prevention, HIV risk factors and prevention, adequate intake of calcium and vitamin D, diet and exercise  return annually or prn  An After Visit Summary was printed and given to the patient.

## 2017-08-12 ENCOUNTER — Other Ambulatory Visit (INDEPENDENT_AMBULATORY_CARE_PROVIDER_SITE_OTHER): Payer: BC Managed Care – PPO

## 2017-08-12 DIAGNOSIS — Z8619 Personal history of other infectious and parasitic diseases: Secondary | ICD-10-CM

## 2017-08-12 DIAGNOSIS — E559 Vitamin D deficiency, unspecified: Secondary | ICD-10-CM

## 2017-08-13 LAB — CYTOLOGY - PAP
Diagnosis: NEGATIVE
HPV: NOT DETECTED

## 2017-08-13 LAB — COMPREHENSIVE METABOLIC PANEL
A/G RATIO: 1.3 (ref 1.2–2.2)
ALBUMIN: 4 g/dL (ref 3.5–5.5)
ALK PHOS: 66 IU/L (ref 39–117)
ALT: 19 IU/L (ref 0–32)
AST: 16 IU/L (ref 0–40)
BUN / CREAT RATIO: 8 — AB (ref 9–23)
BUN: 6 mg/dL (ref 6–24)
Bilirubin Total: 0.3 mg/dL (ref 0.0–1.2)
CO2: 19 mmol/L — AB (ref 20–29)
CREATININE: 0.76 mg/dL (ref 0.57–1.00)
Calcium: 9.1 mg/dL (ref 8.7–10.2)
Chloride: 107 mmol/L — ABNORMAL HIGH (ref 96–106)
GFR calc Af Amer: 113 mL/min/{1.73_m2} (ref >59)
GFR, EST NON AFRICAN AMERICAN: 98 mL/min/{1.73_m2} (ref >59)
GLOBULIN, TOTAL: 3 g/dL (ref 1.5–4.5)
Glucose: 92 mg/dL (ref 65–99)
Potassium: 4.6 mmol/L (ref 3.5–5.2)
SODIUM: 142 mmol/L (ref 134–144)
Total Protein: 7 g/dL (ref 6.0–8.5)

## 2017-08-13 LAB — LIPID PANEL
CHOL/HDL RATIO: 4.4 ratio (ref 0.0–4.4)
CHOLESTEROL TOTAL: 180 mg/dL (ref 100–199)
HDL: 41 mg/dL (ref >39)
LDL CALC: 120 mg/dL — AB (ref 0–99)
Triglycerides: 95 mg/dL (ref 0–149)
VLDL Cholesterol Cal: 19 mg/dL (ref 5–40)

## 2017-08-13 LAB — CBC
HEMATOCRIT: 41 % (ref 34.0–46.6)
Hemoglobin: 13.3 g/dL (ref 11.1–15.9)
MCH: 28.4 pg (ref 26.6–33.0)
MCHC: 32.4 g/dL (ref 31.5–35.7)
MCV: 88 fL (ref 79–97)
Platelets: 268 10*3/uL (ref 150–379)
RBC: 4.68 x10E6/uL (ref 3.77–5.28)
RDW: 13.9 % (ref 12.3–15.4)
WBC: 9.4 10*3/uL (ref 3.4–10.8)

## 2017-08-13 LAB — VITAMIN D 25 HYDROXY (VIT D DEFICIENCY, FRACTURES): Vit D, 25-Hydroxy: 8.7 ng/mL — ABNORMAL LOW (ref 30.0–100.0)

## 2017-08-13 LAB — TSH: TSH: 2.05 u[IU]/mL (ref 0.450–4.500)

## 2017-08-14 ENCOUNTER — Other Ambulatory Visit: Payer: Self-pay | Admitting: Family

## 2017-08-14 MED ORDER — PROMETHAZINE-DM 6.25-15 MG/5ML PO SYRP
5.0000 mL | ORAL_SOLUTION | Freq: Four times a day (QID) | ORAL | 0 refills | Status: DC | PRN
Start: 2017-08-14 — End: 2017-09-21

## 2017-08-14 MED ORDER — AZITHROMYCIN 250 MG PO TABS
ORAL_TABLET | ORAL | 0 refills | Status: DC
Start: 2017-08-14 — End: 2017-09-21

## 2017-08-17 ENCOUNTER — Other Ambulatory Visit: Payer: Self-pay | Admitting: Certified Nurse Midwife

## 2017-08-17 DIAGNOSIS — E559 Vitamin D deficiency, unspecified: Secondary | ICD-10-CM

## 2017-08-18 ENCOUNTER — Telehealth: Payer: Self-pay

## 2017-08-18 MED ORDER — VITAMIN D (ERGOCALCIFEROL) 1.25 MG (50000 UNIT) PO CAPS: 50000.0000 [IU] | ORAL_CAPSULE | ORAL | 0 refills | Status: DC

## 2017-08-18 NOTE — Telephone Encounter (Signed)
Patient returned call to Kaitlyn. °

## 2017-08-18 NOTE — Telephone Encounter (Signed)
Left message to call [place] at [phone].  Notes recorded by Regina Eck, CNM on 08/17/2017 at 12:02 PM EST Notify patient that her CBC is normal, no anemia Liver, kidney and glucose essentially normal Lipid panel shows Normal cholesterol  Triglycerides are normal HDL is borderline normal, work on exercise and good diet with fiber content, fresh vegetables and fruits, lean meat. LDL is borderline elevated at 120 previous 130 so better. Repeat at aex TSH is normal Vitamin D is very low at 8.7 she needs to start on Rx 50,000 weekly and recheck in 3 months, this will increase fatigue and depression, Please schedule recheck

## 2017-08-18 NOTE — Telephone Encounter (Signed)
Spoke with patient. Results given. Patient verbalizes understanding. Rx for Vitamin D 50,000 IU weekly #12 0RF sent to pharmacy on file. 3 month recheck scheduled for 11/19/2017 at 8:30 am. Patient is agreeable to date and time. Encounter closed.

## 2017-08-31 ENCOUNTER — Ambulatory Visit: Payer: BC Managed Care – PPO

## 2017-09-13 ENCOUNTER — Ambulatory Visit
Admission: RE | Admit: 2017-09-13 | Discharge: 2017-09-13 | Disposition: A | Discharge status: Home / Self Care | Payer: BC Managed Care – PPO | Source: Ambulatory Visit | Attending: Certified Nurse Midwife | Admitting: Certified Nurse Midwife

## 2017-09-13 DIAGNOSIS — Z139 Encounter for screening, unspecified: Secondary | ICD-10-CM

## 2017-09-14 ENCOUNTER — Other Ambulatory Visit: Payer: Self-pay | Admitting: Certified Nurse Midwife

## 2017-09-14 DIAGNOSIS — R928 Other abnormal and inconclusive findings on diagnostic imaging of breast: Secondary | ICD-10-CM

## 2017-09-20 ENCOUNTER — Other Ambulatory Visit: Payer: Self-pay | Admitting: Certified Nurse Midwife

## 2017-09-20 ENCOUNTER — Ambulatory Visit
Admission: RE | Admit: 2017-09-20 | Discharge: 2017-09-20 | Disposition: A | Discharge status: Home / Self Care | Payer: BC Managed Care – PPO | Source: Ambulatory Visit | Attending: Certified Nurse Midwife | Admitting: Certified Nurse Midwife

## 2017-09-20 DIAGNOSIS — R928 Other abnormal and inconclusive findings on diagnostic imaging of breast: Secondary | ICD-10-CM

## 2017-09-20 DIAGNOSIS — N6320 Unspecified lump in the left breast, unspecified quadrant: Secondary | ICD-10-CM

## 2017-09-21 ENCOUNTER — Ambulatory Visit: Payer: BC Managed Care – PPO | Admitting: Family Medicine

## 2017-09-21 ENCOUNTER — Encounter: Payer: Self-pay | Admitting: Family Medicine

## 2017-09-21 VITALS — BP 92/62 | HR 95 | Ht 66.5 in | Wt 218.0 lb

## 2017-09-21 DIAGNOSIS — F329 Major depressive disorder, single episode, unspecified: Secondary | ICD-10-CM | POA: Diagnosis not present

## 2017-09-21 DIAGNOSIS — F419 Anxiety disorder, unspecified: Secondary | ICD-10-CM

## 2017-09-21 DIAGNOSIS — E559 Vitamin D deficiency, unspecified: Secondary | ICD-10-CM | POA: Diagnosis not present

## 2017-09-21 MED ORDER — SERTRALINE HCL 100 MG PO TABS: 100.0000 mg | ORAL_TABLET | Freq: Every day | ORAL | 4 refills | Status: DC

## 2017-09-21 NOTE — Progress Notes (Signed)
Subjective:    Patient ID: Audrey Jackson, female    DOB: [birth year], 42 y.o.   MRN: [id number]  No chief complaint on file.   HPI Patient was seen today for f/u and transfer of care.  Pt states she has been doing well overall.  Pt states she was taking Zoloft 100 mg daily, but ran out of meds 1 wk ago.  Pt states he son noticed that she was more "snappy"/short tempered and stated she needed to get back on her meds.  Pt states she too has noticed this and an increase in her anxiety.  Pt has been going to monthly counseling at an office on [address]  Pt does not feel she connects all the way with her therapist, but has been sticking it out.    Pt also endorses increased anxiety 2/2 having a breast biopsy tomorrow.  Pt states the mass was noted on mammogram.    Pt has also been chronically tired.  Pt notes this has been going on for yrs despite getting 8+ hours of sleep per night.  Pt was recently dx'd with Vit D deficiency by her OB/Gyn.  She is taking ergocalciferol 50,000 IU weekly.  At the moment patient has not noticed a difference in her fatigue.  Past Medical History:  Diagnosis Date  . Abnormal Pap smear of cervix 2006  . Allergy   . Anxiety   . Asthma   . Depression   . Headache   . History of migraine   . Hx of migraines   . STD (sexually transmitted disease) 20 years ago   Chamydia    Allergies  Allergen Reactions  . Amoxil [Amoxicillin] Diarrhea and Nausea And Vomiting    Has patient had a PCN reaction causing immediate rash, facial/tongue/throat swelling, SOB or lightheadedness with hypotension: Yes Has patient had a PCN reaction causing severe rash involving mucus membranes or skin necrosis: No Has patient had a PCN reaction that required hospitalization: No Has patient had a PCN reaction occurring within the last 10 years: No If all of the above answers are "NO", then may proceed with Cephalosporin use.   Severe diarrhea & vomiting  . Banana Other (See  Comments)    Shortness of Breath  . Kiwi Extract Other (See Comments)    SOB  . Soy Allergy Itching    ROS General: Denies fever, chills, night sweats, changes in weight, changes in appetite  +fatigue HEENT: Denies headaches, ear pain, changes in vision, rhinorrhea, sore throat CV: Denies CP, palpitations, SOB, orthopnea Pulm: Denies SOB, cough, wheezing GI: Denies abdominal pain, nausea, vomiting, diarrhea, constipation GU: Denies dysuria, hematuria, frequency, vaginal discharge Msk: Denies muscle cramps, joint pains Neuro: Denies weakness, numbness, tingling Skin: Denies rashes, bruising Psych: Denies hallucinations  +depression, anxiety     Objective:    Blood pressure 92/62, pulse 95, height 5' 6.5" (1.689 m), weight 218 lb (98.9 kg).   Gen. Pleasant, well-nourished, in no distress, normal affect   HEENT: [place]/AT, face symmetric, no scleral icterus, PERRLA, nares patent without drainage Lungs: no accessory muscle use, CTAB, no wheezes or rales Cardiovascular: RRR, no m/r/g, no peripheral edema Abdomen: BS present, soft, NT/ND Neuro:  A&Ox3, CN II-XII intact, normal gait   Wt Readings from Last 3 Encounters:  09/21/17 218 lb (98.9 kg)  08/11/17 220 lb (99.8 kg)  05/03/17 214 lb (97.1 kg)    Lab Results  Component Value Date   WBC 9.4 08/12/2017   HGB 13.3 08/12/2017  HCT 41.0 08/12/2017   PLT 268 08/12/2017   GLUCOSE 92 08/12/2017   CHOL 180 08/12/2017   TRIG 95 08/12/2017   HDL 41 08/12/2017   LDLCALC 120 (H) 08/12/2017   ALT 19 08/12/2017   AST 16 08/12/2017   NA 142 08/12/2017   K 4.6 08/12/2017   CL 107 (H) 08/12/2017   CREATININE 0.76 08/12/2017   BUN 6 08/12/2017   CO2 19 (L) 08/12/2017   TSH 2.050 08/12/2017    Assessment/Plan:  Anxiety and depression -PHQ 9 score 6 -GAD 7 score 4 -Pt encouraged to continue counseling monthly.  Pt advised if not fully happy with counseling consider trying a different therapist.  Pt given a handout on area  providers. - Plan: sertraline (ZOLOFT) 100 MG tablet -F/u prn in the next few 3-4 months for anxiety and depression as currently stable.  Sooner if needed.  Vitamin D deficiency -Vitamin D 8.7 on 08/12/2017 -Likely contributing to patient's fatigue -Patient reassured -Continue ergocalciferol 50,000 IU weekly times 12 weeks. -If no improvement seen after 12 weeks further workup necessary.  TSH obtained at same time was normal at 2.050  Next CPE on or after 04/10/2018.  Grier Mitts, MD

## 2017-09-21 NOTE — Patient Instructions (Addendum)
Living With Anxiety  After being diagnosed with an anxiety disorder, you may be relieved to know why you have felt or behaved a certain way. It is natural to also feel overwhelmed about the treatment ahead and what it will mean for your life. With care and support, you can manage this condition and recover from it.  How to cope with anxiety  Dealing with stress  Stress is your body's reaction to life changes and events, both good and bad. Stress can last just a few hours or it can be ongoing. Stress can play a major role in anxiety, so it is important to learn both how to cope with stress and how to think about it differently.  Talk with your health care provider or a counselor to learn more about stress reduction. He or she may suggest some stress reduction techniques, such as:   Music therapy. This can include creating or listening to music that you enjoy and that inspires you.   Mindfulness-based meditation. This involves being aware of your normal breaths, rather than trying to control your breathing. It can be done while sitting or walking.   Centering prayer. This is a kind of meditation that involves focusing on a word, phrase, or sacred image that is meaningful to you and that brings you peace.   Deep breathing. To do this, expand your stomach and inhale slowly through your nose. Hold your breath for 3-5 seconds. Then exhale slowly, allowing your stomach muscles to relax.   Self-talk. This is a skill where you identify thought patterns that lead to anxiety reactions and correct those thoughts.   Muscle relaxation. This involves tensing muscles then relaxing them.    Choose a stress reduction technique that fits your lifestyle and personality. Stress reduction techniques take time and practice. Set aside 5-15 minutes a day to do them. Therapists can offer training in these techniques. The training may be covered by some insurance plans. Other things you can do to manage stress include:   Keeping a  stress diary. This can help you learn what triggers your stress and ways to control your response.   Thinking about how you respond to certain situations. You may not be able to control everything, but you can control your reaction.   Making time for activities that help you relax, and not feeling guilty about spending your time in this way.    Therapy combined with coping and stress-reduction skills provides the best chance for successful treatment.  Medicines  Medicines can help ease symptoms. Medicines for anxiety include:   Anti-anxiety drugs.   Antidepressants.   Beta-blockers.    Medicines may be used as the main treatment for anxiety disorder, along with therapy, or if other treatments are not working. Medicines should be prescribed by a health care provider.  Relationships  Relationships can play a big part in helping you recover. Try to spend more time connecting with trusted friends and family members. Consider going to couples counseling, taking family education classes, or going to family therapy. Therapy can help you and others better understand the condition.  How to recognize changes in your condition  Everyone has a different response to treatment for anxiety. Recovery from anxiety happens when symptoms decrease and stop interfering with your daily activities at home or work. This may mean that you will start to:   Have better concentration and focus.   Sleep better.   Be less irritable.   Have more energy.   Have improved   memory.    It is important to recognize when your condition is getting worse. Contact your health care provider if your symptoms interfere with home or work and you do not feel like your condition is improving.  Where to find help and support:  You can get help and support from these sources:   Self-help groups.   Online and community organizations.   A trusted spiritual leader.   Couples counseling.   Family education classes.   Family therapy.    Follow these  instructions at home:   Eat a healthy diet that includes plenty of vegetables, fruits, whole grains, low-fat dairy products, and lean protein. Do not eat a lot of foods that are high in solid fats, added sugars, or salt.   Exercise. Most adults should do the following:  ? Exercise for at least 150 minutes each week. The exercise should increase your heart rate and make you sweat (moderate-intensity exercise).  ? Strengthening exercises at least twice a week.   Cut down on caffeine, tobacco, alcohol, and other potentially harmful substances.   Get the right amount and quality of sleep. Most adults need 7-9 hours of sleep each night.   Make choices that simplify your life.   Take over-the-counter and prescription medicines only as told by your health care provider.   Avoid caffeine, alcohol, and certain over-the-counter cold medicines. These may make you feel worse. Ask your pharmacist which medicines to avoid.   Keep all follow-up visits as told by your health care provider. This is important.  Questions to ask your health care provider   Would I benefit from therapy?   How often should I follow up with a health care provider?   How long do I need to take medicine?   Are there any long-term side effects of my medicine?   Are there any alternatives to taking medicine?  Contact a health care provider if:   You have a hard time staying focused or finishing daily tasks.   You spend many hours a day feeling worried about everyday life.   You become exhausted by worry.   You start to have headaches, feel tense, or have nausea.   You urinate more than normal.   You have diarrhea.  Get help right away if:   You have a racing heart and shortness of breath.   You have thoughts of hurting yourself or others.  If you ever feel like you may hurt yourself or others, or have thoughts about taking your own life, get help right away. You can go to your nearest emergency department or call:   Your local emergency  services (911 in the [place]   A suicide crisis helpline, such as the National Suicide Prevention Lifeline at 1-800-273-8255. This is open 24-hours a day.    Summary   Taking steps to deal with stress can help calm you.   Medicines cannot cure anxiety disorders, but they can help ease symptoms.   Family, friends, and partners can play a big part in helping you recover from an anxiety disorder.  This information is not intended to replace advice given to you by your health care provider. Make sure you discuss any questions you have with your health care provider.  Document Released: 07/21/2016 Document Revised: 07/21/2016 Document Reviewed: 07/21/2016  Elsevier Interactive Patient Education  2018 [employer]      Living With Depression  Everyone experiences occasional disappointment, sadness, and loss in their lives. When you are feeling   down, blue, or sad for at least 2 weeks in a row, it may mean that you have depression. Depression can affect your thoughts and feelings, relationships, daily activities, and physical health. It is caused by changes in the way your brain functions. If you receive a diagnosis of depression, your health care provider will tell you which type of depression you have and what treatment options are available to you.  If you are living with depression, there are ways to help you recover from it and also ways to prevent it from coming back.  How to cope with lifestyle changes  Coping with stress  Stress is your body's reaction to life changes and events, both good and bad. Stressful situations may include:   Getting married.   The death of a spouse.   Losing a job.   Retiring.   Having a baby.    Stress can last just a few hours or it can be ongoing. Stress can play a major role in depression, so it is important to learn both how to cope with stress and how to think about it differently.  Talk with your health care provider or a counselor if you would like to learn more about  stress reduction. He or she may suggest some stress reduction techniques, such as:   Music therapy. This can include creating music or listening to music. Choose music that you enjoy and that inspires you.   Mindfulness-based meditation. This kind of meditation can be done while sitting or walking. It involves being aware of your normal breaths, rather than trying to control your breathing.   Centering prayer. This is a kind of meditation that involves focusing on a spiritual word or phrase. Choose a word, phrase, or sacred image that is meaningful to you and that brings you peace.   Deep breathing. To do this, expand your stomach and inhale slowly through your nose. Hold your breath for 3-5 seconds, then exhale slowly, allowing your stomach muscles to relax.   Muscle relaxation. This involves intentionally tensing muscles then relaxing them.    Choose a stress reduction technique that fits your lifestyle and personality. Stress reduction techniques take time and practice to develop. Set aside 5-15 minutes a day to do them. Therapists can offer training in these techniques. The training may be covered by some insurance plans. Other things you can do to manage stress include:   Keeping a stress diary. This can help you learn what triggers your stress and ways to control your response.   Understanding what your limits are and saying no to requests or events that lead to a schedule that is too full.   Thinking about how you respond to certain situations. You may not be able to control everything, but you can control how you react.   Adding humor to your life by watching funny films or TV shows.   Making time for activities that help you relax and not feeling guilty about spending your time this way.    Medicines  Your health care provider may suggest certain medicines if he or she feels that they will help improve your condition. Avoid using alcohol and other substances that may prevent your medicines from  working properly (may interact). It is also important to:   Talk with your pharmacist or health care provider about all the medicines that you take, their possible side effects, and what medicines are safe to take together.   Make it your goal to take part   in all treatment decisions (shared decision-making). This includes giving input on the side effects of medicines. It is best if shared decision-making with your health care provider is part of your total treatment plan.    If your health care provider prescribes a medicine, you may not notice the full benefits of it for 4-8 weeks. Most people who are treated for depression need to be on medicine for at least 6-12 months after they feel better. If you are taking medicines as part of your treatment, do not stop taking medicines without first talking to your health care provider. You may need to have the medicine slowly decreased (tapered) over time to decrease the risk of harmful side effects.  Relationships  Your health care provider may suggest family therapy along with individual therapy and drug therapy. While there may not be family problems that are causing you to feel depressed, it is still important to make sure your family learns as much as they can about your mental health. Having your family's support can help make your treatment successful.  How to recognize changes in your condition  Everyone has a different response to treatment for depression. Recovery from major depression happens when you have not had signs of major depression for two months. This may mean that you will start to:   Have more interest in doing activities.   Feel less hopeless than you did 2 months ago.   Have more energy.   Overeat less often, or have better or improving appetite.   Have better concentration.    Your health care provider will work with you to decide the next steps in your recovery. It is also important to recognize when your condition is getting worse. Watch  for these signs:   Having fatigue or low energy.   Eating too much or too little.   Sleeping too much or too little.   Feeling restless, agitated, or hopeless.   Having trouble concentrating or making decisions.   Having unexplained physical complaints.   Feeling irritable, angry, or aggressive.    Get help as soon as you or your family members notice these symptoms coming back.  How to get support and help from others  How to talk with friends and family members about your condition  Talking to friends and family members about your condition can provide you with one way to get support and guidance. Reach out to trusted friends or family members, explain your symptoms to them, and let them know that you are working with a health care provider to treat your depression.  Financial resources  Not all insurance plans cover mental health care, so it is important to check with your insurance carrier. If paying for co-pays or counseling services is a problem, search for a local or county mental health care center. They may be able to offer public mental health care services at low or no cost when you are not able to see a private health care provider.  If you are taking medicine for depression, you may be able to get the generic form, which may be less expensive. Some makers of prescription medicines also offer help to patients who cannot afford the medicines they need.  Follow these instructions at home:   Get the right amount and quality of sleep.   Cut down on using caffeine, tobacco, alcohol, and other potentially harmful substances.   Try to exercise, such as walking or lifting small weights.   Take over-the-counter and prescription medicines   only as told by your health care provider.   Eat a healthy diet that includes plenty of vegetables, fruits, whole grains, low-fat dairy products, and lean protein. Do not eat a lot of foods that are high in solid fats, added sugars, or salt.   Keep all follow-up  visits as told by your health care provider. This is important.  Contact a health care provider if:   You stop taking your antidepressant medicines, and you have any of these symptoms:  ? Nausea.  ? Headache.  ? Feeling lightheaded.  ? Chills and body aches.  ? Not being able to sleep (insomnia).   You or your friends and family think your depression is getting worse.  Get help right away if:   You have thoughts of hurting yourself or others.  If you ever feel like you may hurt yourself or others, or have thoughts about taking your own life, get help right away. You can go to your nearest emergency department or call:   Your local emergency services (911 in the [place]   A suicide crisis helpline, such as the National Suicide Prevention Lifeline at 1-800-273-8255. This is open 24-hours a day.    Summary   If you are living with depression, there are ways to help you recover from it and also ways to prevent it from coming back.   Work with your health care team to create a management plan that includes counseling, stress management techniques, and healthy lifestyle habits.  This information is not intended to replace advice given to you by your health care provider. Make sure you discuss any questions you have with your health care provider.  Document Released: 06/29/2016 Document Revised: 06/29/2016 Document Reviewed: 06/29/2016  Elsevier Interactive Patient Education  2018 [employer]

## 2017-09-22 ENCOUNTER — Other Ambulatory Visit: Payer: Self-pay | Admitting: Certified Nurse Midwife

## 2017-09-22 ENCOUNTER — Ambulatory Visit
Admission: RE | Admit: 2017-09-22 | Discharge: 2017-09-22 | Disposition: A | Discharge status: Home / Self Care | Payer: BC Managed Care – PPO | Source: Ambulatory Visit | Attending: Certified Nurse Midwife | Admitting: Certified Nurse Midwife

## 2017-09-22 DIAGNOSIS — N6310 Unspecified lump in the right breast, unspecified quadrant: Secondary | ICD-10-CM

## 2017-09-22 DIAGNOSIS — N6320 Unspecified lump in the left breast, unspecified quadrant: Secondary | ICD-10-CM

## 2017-10-04 ENCOUNTER — Ambulatory Visit: Payer: BC Managed Care – PPO | Admitting: Family Medicine

## 2017-10-04 ENCOUNTER — Encounter: Payer: Self-pay | Admitting: Family Medicine

## 2017-10-04 VITALS — BP 102/70 | HR 82 | Temp 98.7°F | Ht 66.5 in | Wt 217.0 lb

## 2017-10-04 DIAGNOSIS — R112 Nausea with vomiting, unspecified: Secondary | ICD-10-CM

## 2017-10-04 DIAGNOSIS — R195 Other fecal abnormalities: Secondary | ICD-10-CM

## 2017-10-04 LAB — CBC WITH DIFFERENTIAL/PLATELET
BASOS ABS: 0.1 10*3/uL (ref 0.0–0.1)
BASOS PCT: 0.6 % (ref 0.0–3.0)
EOS ABS: 0.3 10*3/uL (ref 0.0–0.7)
Eosinophils Relative: 3.7 % (ref 0.0–5.0)
HCT: 42.2 % (ref 36.0–46.0)
HEMOGLOBIN: 14.1 g/dL (ref 12.0–15.0)
Lymphocytes Relative: 22.8 % (ref 12.0–46.0)
Lymphs Abs: 1.8 10*3/uL (ref 0.7–4.0)
MCHC: 33.5 g/dL (ref 30.0–36.0)
MCV: 84.5 fl (ref 78.0–100.0)
Monocytes Absolute: 0.6 10*3/uL (ref 0.1–1.0)
Monocytes Relative: 7.7 % (ref 3.0–12.0)
Neutro Abs: 5.1 10*3/uL (ref 1.4–7.7)
Neutrophils Relative %: 65.2 % (ref 43.0–77.0)
Platelets: 301 10*3/uL (ref 150.0–400.0)
RBC: 4.99 Mil/uL (ref 3.87–5.11)
RDW: 13 % (ref 11.5–15.5)
WBC: 7.8 10*3/uL (ref 4.0–10.5)

## 2017-10-04 LAB — COMPREHENSIVE METABOLIC PANEL
ALT: 9 U/L (ref 0–35)
AST: 10 U/L (ref 0–37)
Albumin: 4.1 g/dL (ref 3.5–5.2)
Alkaline Phosphatase: 55 U/L (ref 39–117)
BILIRUBIN TOTAL: 0.5 mg/dL (ref 0.2–1.2)
BUN: 8 mg/dL (ref 6–23)
CALCIUM: 9.7 mg/dL (ref 8.4–10.5)
CHLORIDE: 104 meq/L (ref 96–112)
CO2: 27 meq/L (ref 19–32)
Creatinine, Ser: 0.86 mg/dL (ref 0.40–1.20)
GFR: 77.44 mL/min (ref >60.00)
Glucose, Bld: 91 mg/dL (ref 70–99)
Potassium: 4.5 mEq/L (ref 3.5–5.1)
Sodium: 137 mEq/L (ref 135–145)
Total Protein: 7.2 g/dL (ref 6.0–8.3)

## 2017-10-04 LAB — LIPASE: Lipase: 16 U/L (ref 11.0–59.0)

## 2017-10-04 MED ORDER — ONDANSETRON HCL 4 MG PO TABS: 4.0000 mg | ORAL_TABLET | Freq: Three times a day (TID) | ORAL | 0 refills | Status: DC | PRN

## 2017-10-04 NOTE — Progress Notes (Signed)
Subjective:    Patient ID: Audrey Jackson, female    DOB: 10/29/1975, 42 y.o.   MRN: [id number]  Chief Complaint  Patient presents with  . Nausea  . Emesis  . Diarrhea    HPI Pt was seen today for ongoing acute concern.  Pt endorses nausea, vomiting, loose stools since 09/22/17.  Pt states she has had about 4 episodes within the last 2 weeks.  Pt cannot recall any changes in diet  Or medications.  Pt endorses the feeling after eating, may have random pains in her abdomen as well.  Pt states she has had to leave work early on a few occasions for symptoms.  Pt denies being pregnant she has an IUD in place.  Pt also endorses increased symptoms of heart burn.  Pt denies blood in stool. Pt has a history of IBS.  She has never been on medication for this.  Past Medical History:  Diagnosis Date  . Abnormal Pap smear of cervix 2006  . Allergy   . Anxiety   . Asthma   . Depression   . Headache   . History of migraine   . Hx of migraines   . STD (sexually transmitted disease) 20 years ago   Chamydia    Allergies  Allergen Reactions  . Amoxil [Amoxicillin] Diarrhea and Nausea And Vomiting    Has patient had a PCN reaction causing immediate rash, facial/tongue/throat swelling, SOB or lightheadedness with hypotension: Yes Has patient had a PCN reaction causing severe rash involving mucus membranes or skin necrosis: No Has patient had a PCN reaction that required hospitalization: No Has patient had a PCN reaction occurring within the last 10 years: No If all of the above answers are "NO", then may proceed with Cephalosporin use.   Severe diarrhea & vomiting  . Banana Other (See Comments)    Shortness of Breath  . Kiwi Extract Other (See Comments)    SOB  . Soy Allergy Itching    ROS General: Denies fever, chills, night sweats, changes in weight, changes in appetite HEENT: Denies headaches, ear pain, changes in vision, rhinorrhea, sore throat CV: Denies CP, palpitations, SOB,  orthopnea Pulm: Denies SOB, cough, wheezing GI: Denies diarrhea, constipation  + nausea, vomiting, loose stools, abdominal pain, h/o IBS GU: Denies dysuria, hematuria, frequency, vaginal discharge Msk: Denies muscle cramps, joint pains Neuro: Denies weakness, numbness, tingling Skin: Denies rashes, bruising Psych: Denies depression, anxiety, hallucinations     Objective:    Blood pressure 102/70, pulse 82, temperature 98.7 F (37.1 C), temperature source Oral, height 5' 6.5" (1.689 m), weight 217 lb (98.4 kg), SpO2 97 %.   Gen. Pleasant, well-nourished, in no distress, normal affect   HEENT: [place]/AT, face symmetric, no scleral icterus, PERRLA, nares patent without drainage, pharynx without erythema or exudate.  TMs normal.  No cervical lymphadenopathy. Lungs: no accessory muscle use, CTAB, no wheezes or rales Cardiovascular: RRR, no m/r/g, no peripheral edema Abdomen: BS present, soft, NT/ND, no hepatosplenomegaly. Neuro:  A&Ox3, CN II-XII intact, normal gait   Wt Readings from Last 3 Encounters:  10/04/17 217 lb (98.4 kg)  09/21/17 218 lb (98.9 kg)  08/11/17 220 lb (99.8 kg)    Lab Results  Component Value Date   WBC 9.4 08/12/2017   HGB 13.3 08/12/2017   HCT 41.0 08/12/2017   PLT 268 08/12/2017   GLUCOSE 92 08/12/2017   CHOL 180 08/12/2017   TRIG 95 08/12/2017   HDL 41 08/12/2017   LDLCALC 120 (H) 08/12/2017  ALT 19 08/12/2017   AST 16 08/12/2017   NA 142 08/12/2017   K 4.6 08/12/2017   CL 107 (H) 08/12/2017   CREATININE 0.76 08/12/2017   BUN 6 08/12/2017   CO2 19 (L) 08/12/2017   TSH 2.050 08/12/2017    Assessment/Plan:  Non-intractable vomiting with nausea, unspecified vomiting type  -ddx viral, gallbladder dz., IBS, reflux, pregnancy--however IUD in place -Patient encouraged to keep a record of foods that make her head symptoms. -Patient advised to avoid spicy, acidic foods -Given handout - Plan: CBC with Differential/Platelet, Comprehensive metabolic  panel, Lipase, US Abdomen Limited, ondansetron (ZOFRAN) 4 MG tablet, CBC with Differential/Platelet  Loose stools -Given handout  - Plan: US Abdomen Limited  Follow-up in the next few weeks, sooner if needed  Grier Mitts, MD

## 2017-10-04 NOTE — Patient Instructions (Addendum)
Nausea and Vomiting, Adult Nausea is the feeling that you have an upset stomach or have to vomit. As nausea gets worse, it can lead to vomiting. Vomiting occurs when stomach contents are thrown up and out of the mouth. Vomiting can make you feel weak and cause you to become dehydrated. Dehydration can make you tired and thirsty, cause you to have a dry mouth, and decrease how often you urinate. Older adults and people with other diseases or a weak immune system are at higher risk for dehydration. It is important to treat your nausea and vomiting as told by your health care provider. Follow these instructions at home: Follow instructions from your health care provider about how to care for yourself at home. Eating and drinking Follow these recommendations as told by your health care provider:  Take an oral rehydration solution (ORS). This is a drink that is sold at pharmacies and retail stores.  Drink clear fluids in small amounts as you are able. Clear fluids include water, ice chips, diluted fruit juice, and low-calorie sports drinks.  Eat bland, easy-to-digest foods in small amounts as you are able. These foods include bananas, applesauce, rice, lean meats, toast, and crackers.  Avoid fluids that contain a lot of sugar or caffeine, such as energy drinks, sports drinks, and soda.  Avoid alcohol.  Avoid spicy or fatty foods.  General instructions  Drink enough fluid to keep your urine clear or pale yellow.  Wash your hands often. If soap and water are not available, use hand sanitizer.  Make sure that all people in your household wash their hands well and often.  Take over-the-counter and prescription medicines only as told by your health care provider.  Rest at home while you recover.  Watch your condition for any changes.  Breathe slowly and deeply when you feel nauseated.  Keep all follow-up visits as told by your health care provider. This is important. Contact a health care  provider if:  You have a fever.  You cannot keep fluids down.  Your symptoms get worse.  You have new symptoms.  Your nausea does not go away after two days.  You feel light-headed or dizzy.  You have a headache.  You have muscle cramps. Get help right away if:  You have pain in your chest, neck, arm, or jaw.  You feel extremely weak or you faint.  You have persistent vomiting.  You see blood in your vomit.  Your vomit looks like black coffee grounds.  You have bloody or black stools or stools that look like tar.  You have a severe headache, a stiff neck, or both.  You have a rash.  You have severe pain, cramping, or bloating in your abdomen.  You have trouble breathing or you are breathing very quickly.  Your heart is beating very quickly.  Your skin feels cold and clammy.  You feel confused.  You have pain when you urinate.  You have signs of dehydration, such as: ? Dark urine, very little urine, or no urine. ? Cracked lips. ? Dry mouth. ? Sunken eyes. ? Sleepiness. ? Weakness. These symptoms may represent a serious problem that is an emergency. Do not wait to see if the symptoms will go away. Get medical help right away. Call your local emergency services (911 in the [place] Do not drive yourself to the hospital. This information is not intended to replace advice given to you by your health care provider. Make sure you discuss any questions you   have with your health care provider. Document Released: 07/27/2005 Document Revised: 12/30/2015 Document Reviewed: 04/02/2015 Elsevier Interactive Patient Education  2018 [place] Choices to Help Relieve Diarrhea, Adult When you have diarrhea, the foods you eat and your eating habits are very important. Choosing the right foods and drinks can help:  Relieve diarrhea.  Replace lost fluids and nutrients.  Prevent dehydration.  What general guidelines should I follow? Relieving diarrhea  Choose  foods with less than 2 g or .07 oz. of fiber per serving.  Limit fats to less than 8 tsp (38 g or 1.34 oz.) a day.  Avoid the following: ? Foods and beverages sweetened with high-fructose corn syrup, honey, or sugar alcohols such as xylitol, sorbitol, and mannitol. ? Foods that contain a lot of fat or sugar. ? Fried, greasy, or spicy foods. ? High-fiber grains, breads, and cereals. ? Raw fruits and vegetables.  Eat foods that are rich in probiotics. These foods include dairy products such as yogurt and fermented milk products. They help increase healthy bacteria in the stomach and intestines (gastrointestinal tract, or GI tract).  If you have lactose intolerance, avoid dairy products. These may make your diarrhea worse.  Take medicine to help stop diarrhea (antidiarrheal medicine) only as told by your health care provider. Replacing nutrients  Eat small meals or snacks every 3-4 hours.  Eat bland foods, such as white rice, toast, or baked potato, until your diarrhea starts to get better. Gradually reintroduce nutrient-rich foods as tolerated or as told by your health care provider. This includes: ? Well-cooked protein foods. ? Peeled, seeded, and soft-cooked fruits and vegetables. ? Low-fat dairy products.  Take vitamin and mineral supplements as told by your health care provider. Preventing dehydration   Start by sipping water or a special solution to prevent dehydration (oral rehydration solution, ORS). Urine that is clear or pale yellow means that you are getting enough fluid.  Try to drink at least 8-10 cups of fluid each day to help replace lost fluids.  You may add other liquids in addition to water, such as clear juice or decaffeinated sports drinks, as tolerated or as told by your health care provider.  Avoid drinks with caffeine, such as coffee, tea, or soft drinks.  Avoid alcohol. What foods are recommended? The items listed may not be a complete list. Talk with your  health care provider about what dietary choices are best for you. Grains White rice. White, [place], or pita breads (fresh or toasted), including plain rolls, buns, or bagels. White pasta. Saltine, soda, or graham crackers. Pretzels. Low-fiber cereal. Cooked cereals made with water (such as cornmeal, farina, or cream cereals). Plain muffins. Matzo. Melba toast. Zwieback. Vegetables Potatoes (without the skin). Most well-cooked and canned vegetables without skins or seeds. Tender lettuce. Fruits Apple sauce. Fruits canned in juice. Cooked apricots, cherries, grapefruit, peaches, pears, or plums. Fresh bananas and cantaloupe. Meats and other protein foods Baked or boiled chicken. Eggs. Tofu. Fish. Seafood. Smooth nut butters. Ground or well-cooked tender beef, ham, veal, lamb, pork, or poultry. Dairy Plain yogurt, kefir, and unsweetened liquid yogurt. Lactose-free milk, buttermilk, skim milk, or soy milk. Low-fat or nonfat hard cheese. Beverages Water. Low-calorie sports drinks. Fruit juices without pulp. Strained tomato and vegetable juices. Decaffeinated teas. Sugar-free beverages not sweetened with sugar alcohols. Oral rehydration solutions, if approved by your health care provider. Seasoning and other foods Bouillon, broth, or soups made from recommended foods. What foods are not recommended? The items listed  may not be a complete list. Talk with your health care provider about what dietary choices are best for you. Grains Whole grain, whole wheat, bran, or rye breads, rolls, pastas, and crackers. Wild or brown rice. Whole grain or bran cereals. Barley. Oats and oatmeal. Corn tortillas or taco shells. Granola. Popcorn. Vegetables Raw vegetables. Fried vegetables. Cabbage, broccoli, Brussels sprouts, artichokes, baked beans, beet greens, corn, kale, legumes, peas, sweet potatoes, and yams. Potato skins. Cooked spinach and cabbage. Fruits Dried fruit, including raisins and dates. Raw fruits.  Stewed or dried prunes. Canned fruits with syrup. Meat and other protein foods Fried or fatty meats. Deli meats. Chunky nut butters. Nuts and seeds. Beans and lentils. Berniece Salines. Hot dogs. Sausage. Dairy High-fat cheeses. Whole milk, chocolate milk, and beverages made with milk, such as milk shakes. Half-and-half. Cream. sour cream. Ice cream. Beverages Caffeinated beverages (such as coffee, tea, soda, or energy drinks). Alcoholic beverages. Fruit juices with pulp. Prune juice. Soft drinks sweetened with high-fructose corn syrup or sugar alcohols. High-calorie sports drinks. Fats and oils Butter. Cream sauces. Margarine. Salad oils. Plain salad dressings. Olives. Avocados. Mayonnaise. Sweets and desserts Sweet rolls, doughnuts, and sweet breads. Sugar-free desserts sweetened with sugar alcohols such as xylitol and sorbitol. Seasoning and other foods Honey. Hot sauce. Chili powder. Gravy. Cream-based or milk-based soups. Pancakes and waffles. Summary  When you have diarrhea, the foods you eat and your eating habits are very important.  Make sure you get at least 8-10 cups of fluid each day, or enough to keep your urine clear or pale yellow.  Eat bland foods and gradually reintroduce healthy, nutrient-rich foods as tolerated, or as told by your health care provider.  Avoid high-fiber, fried, greasy, or spicy foods. This information is not intended to replace advice given to you by your health care provider. Make sure you discuss any questions you have with your health care provider. Document Released: 10/17/2003 Document Revised: 07/24/2016 Document Reviewed: 07/24/2016 Elsevier Interactive Patient Education  2018 [employer]. Irritable Bowel Syndrome, Adult Irritable bowel syndrome (IBS) is not one specific disease. It is a group of symptoms that affects the organs responsible for digestion (gastrointestinal or GI tract). To regulate how your GI tract works, your body sends signals back and  forth between your intestines and your brain. If you have IBS, there may be a problem with these signals. As a result, your GI tract does not function normally. Your intestines may become more sensitive and overreact to certain things. This is especially true when you eat certain foods or when you are under stress. There are four types of IBS. These may be determined based on the consistency of your stool:  IBS with diarrhea.  IBS with constipation.  Mixed IBS.  Unsubtyped IBS.  It is important to know which type of IBS you have. Some treatments are more likely to be helpful for certain types of IBS. What are the causes? The exact cause of IBS is not known. What increases the risk? You may have a higher risk of IBS if:  You are a woman.  You are younger than 41 years old.  You have a family history of IBS.  You have mental health problems.  You have had bacterial infection of your GI tract.  What are the signs or symptoms? Symptoms of IBS vary from person to person. The main symptom is abdominal pain or discomfort. Additional symptoms usually include one or more of the following:  Diarrhea, constipation, or both.  Abdominal  swelling or bloating.  Feeling full or sick after eating a small or regular-size meal.  Frequent gas.  Mucus in the stool.  A feeling of having more stool left after a bowel movement.  Symptoms tend to come and go. They may be associated with stress, psychiatric conditions, or nothing at all. How is this diagnosed? There is no specific test to diagnose IBS. Your health care provider will make a diagnosis based on a physical exam, medical history, and your symptoms. You may have other tests to rule out other conditions that may be causing your symptoms. These may include:  Blood tests.  X-rays.  CT scan.  Endoscopy and colonoscopy. This is a test in which your GI tract is viewed with a long, thin, flexible tube.  How is this treated? There is  no cure for IBS, but treatment can help relieve symptoms. IBS treatment often includes:  Changes to your diet, such as: ? Eating more fiber. ? Avoiding foods that cause symptoms. ? Drinking more water. ? Eating regular, medium-sized portioned meals.  Medicines. These may include: ? Fiber supplements if you have constipation. ? Medicine to control diarrhea (antidiarrheal medicines). ? Medicine to help control muscle spasms in your GI tract (antispasmodic medicines). ? Medicines to help with any mental health issues, such as antidepressants or tranquilizers.  Therapy. ? Talk therapy may help with anxiety, depression, or other mental health issues that can make IBS symptoms worse.  Stress reduction. ? Managing your stress can help keep symptoms under control.  Follow these instructions at home:  Take medicines only as directed by your health care provider.  Eat a healthy diet. ? Avoid foods and drinks with added sugar. ? Include more whole grains, fruits, and vegetables gradually into your diet. This may be especially helpful if you have IBS with constipation. ? Avoid any foods and drinks that make your symptoms worse. These may include dairy products and caffeinated or carbonated drinks. ? Do not eat large meals. ? Drink enough fluid to keep your urine clear or pale yellow.  Exercise regularly. Ask your health care provider for recommendations of good activities for you.  Keep all follow-up visits as directed by your health care provider. This is important. Contact a health care provider if:  You have constant pain.  You have trouble or pain with swallowing.  You have worsening diarrhea. Get help right away if:  You have severe and worsening abdominal pain.  You have diarrhea and: ? You have a rash, stiff neck, or severe headache. ? You are irritable, sleepy, or difficult to awaken. ? You are weak, dizzy, or extremely thirsty.  You have bright red blood in your stool  or you have black tarry stools.  You have unusual abdominal swelling that is painful.  You vomit continuously.  You vomit blood (hematemesis).  You have both abdominal pain and a fever. This information is not intended to replace advice given to you by your health care provider. Make sure you discuss any questions you have with your health care provider. Document Released: 07/27/2005 Document Revised: 12/27/2015 Document Reviewed: 04/13/2014 Elsevier Interactive Patient Education  2018 [employer].

## 2017-10-15 ENCOUNTER — Other Ambulatory Visit: Payer: BC Managed Care – PPO

## 2017-11-08 ENCOUNTER — Other Ambulatory Visit: Payer: Self-pay | Admitting: Certified Nurse Midwife

## 2017-11-19 ENCOUNTER — Telehealth: Payer: Self-pay | Admitting: Certified Nurse Midwife

## 2017-11-19 ENCOUNTER — Other Ambulatory Visit: Payer: BC Managed Care – PPO

## 2017-11-19 NOTE — Telephone Encounter (Signed)
Left message on voicemail regarding missed lab appointment. °

## 2018-01-17 ENCOUNTER — Other Ambulatory Visit: Payer: Self-pay | Admitting: Family Medicine

## 2018-01-17 DIAGNOSIS — F329 Major depressive disorder, single episode, unspecified: Secondary | ICD-10-CM

## 2018-01-17 DIAGNOSIS — F419 Anxiety disorder, unspecified: Principal | ICD-10-CM

## 2018-02-17 ENCOUNTER — Telehealth: Payer: Self-pay | Admitting: Certified Nurse Midwife

## 2018-02-17 DIAGNOSIS — Z30432 Encounter for removal of intrauterine contraceptive device: Secondary | ICD-10-CM

## 2018-02-17 NOTE — Telephone Encounter (Signed)
Reviewed with Audrey Jackson, CNM, call returned to patient. Can have IUD removal and consult same visit, will need to change appointment time to accommodate.   OV scheduled for 2pm on 02/18/18 with Audrey Jackson, CNM, 11am OV cancelled. Patient aware will be notified of benefits prior to appt.   Order placed for IUD removal.  Routing to provider for final review. Patient is agreeable to disposition. Will close encounter.  Cc: [name], [address] [employer]

## 2018-02-17 NOTE — Telephone Encounter (Signed)
Patient would like to know if she can have her Mirena removed at her consultation visit tomorrow.

## 2018-02-17 NOTE — Telephone Encounter (Signed)
Spoke with patient. Patient requesting IUD removal at [place] on 7/12. Patient does not desire new contraceptive, is coming in for preconception consult. Will review with Melvia Heaps, CNM and return call, patient agreeable.

## 2018-02-18 ENCOUNTER — Other Ambulatory Visit: Payer: Self-pay

## 2018-02-18 ENCOUNTER — Ambulatory Visit (INDEPENDENT_AMBULATORY_CARE_PROVIDER_SITE_OTHER): Payer: BC Managed Care – PPO | Admitting: Certified Nurse Midwife

## 2018-02-18 ENCOUNTER — Encounter: Payer: Self-pay | Admitting: Certified Nurse Midwife

## 2018-02-18 ENCOUNTER — Ambulatory Visit: Payer: BC Managed Care – PPO | Admitting: Certified Nurse Midwife

## 2018-02-18 VITALS — BP 110/64 | HR 70 | Resp 16 | Ht 65.75 in | Wt 222.0 lb

## 2018-02-18 DIAGNOSIS — Z23 Encounter for immunization: Secondary | ICD-10-CM | POA: Diagnosis not present

## 2018-02-18 DIAGNOSIS — Z789 Other specified health status: Secondary | ICD-10-CM

## 2018-02-18 DIAGNOSIS — Z30432 Encounter for removal of intrauterine contraceptive device: Secondary | ICD-10-CM

## 2018-02-18 NOTE — Patient Instructions (Signed)
Preparing for Pregnancy If you are considering becoming pregnant, make an appointment to see your regular health care provider to learn how to prepare for a safe and healthy pregnancy (preconception care). During a preconception care visit, your health care provider will:  Do a complete physical exam, including a Pap test.  Take a complete medical history.  Give you information, answer your questions, and help you resolve problems.  Preconception checklist Medical history  Tell your health care provider about any current or past medical conditions. Your pregnancy or your ability to become pregnant may be affected by chronic conditions, such as diabetes, chronic hypertension, and thyroid problems.  Include your family's medical history as well as your partner's medical history.  Tell your health care provider about any history of STIs (sexually transmitted infections).These can affect your pregnancy. In some cases, they can be passed to your baby. Discuss any concerns that you have about STIs.  If indicated, discuss the benefits of genetic testing. This testing will show whether there are any genetic conditions that may be passed from you or your partner to your baby.  Tell your health care provider about: ? Any problems you have had with conception or pregnancy. ? Any medicines you take. These include vitamins, herbal supplements, and over-the-counter medicines. ? Your history of immunizations. Discuss any vaccinations that you may need.  Diet  Ask your health care provider what to include in a healthy diet that has a balance of nutrients. This is especially important when you are pregnant or preparing to become pregnant.  Ask your health care provider to help you reach a healthy weight before pregnancy. ? If you are overweight, you may be at higher risk for certain complications, such as high blood pressure, diabetes, and preterm birth. ? If you are underweight, you are more likely  to have a baby who has a low birth weight.  Lifestyle, work, and home  Let your health care provider know: ? About any lifestyle habits that you have, such as alcohol use, drug use, or smoking. ? About recreational activities that may put you at risk during pregnancy, such as downhill skiing and certain exercise programs. ? Tell your health care provider about any international travel, especially any travel to places with an active Zika virus outbreak. ? About harmful substances that you may be exposed to at work or at home. These include chemicals, pesticides, radiation, or even litter boxes. ? If you do not feel safe at home.  Mental health  Tell your health care provider about: ? Any history of mental health conditions, including feelings of depression, sadness, or anxiety. ? Any medicines that you take for a mental health condition. These include herbs and supplements.  Home instructions to prepare for pregnancy Lifestyle  Eat a balanced diet. This includes fresh fruits and vegetables, whole grains, lean meats, low-fat dairy products, healthy fats, and foods that are high in fiber. Ask to meet with a nutritionist or registered dietitian for assistance with meal planning and goals.  Get regular exercise. Try to be active for at least 30 minutes a day on most days of the week. Ask your health care provider which activities are safe during pregnancy.  Do not use any products that contain nicotine or tobacco, such as cigarettes and e-cigarettes. If you need help quitting, ask your health care provider.  Do not drink alcohol.  Do not take illegal drugs.  Maintain a healthy weight. Ask your health care provider what weight range is   right for you.  General instructions  Keep an accurate record of your menstrual periods. This makes it easier for your health care provider to determine your baby's due date.  Begin taking prenatal vitamins and folic acid supplements daily as directed by  your health care provider.  Manage any chronic conditions, such as high blood pressure and diabetes, as told by your health care provider. This is important.  How do I know that I am pregnant? You may be pregnant if you have been sexually active and you miss your period. Symptoms of early pregnancy include:  Mild cramping.  Very light vaginal bleeding (spotting).  Feeling unusually tired.  Nausea and vomiting (morning sickness).  If you have any of these symptoms and you suspect that you might be pregnant, you can take a home pregnancy test. These tests check for a hormone in your urine (human chorionic gonadotropin, or hCG). A woman's body begins to make this hormone during early pregnancy. These tests are very accurate. Wait until at least the first day after you miss your period to take one. If the test shows that you are pregnant (you get a positive result), call your health care provider to make an appointment for prenatal care. What should I do if I become pregnant?  Make an appointment with your health care provider as soon as you suspect you are pregnant.  Do not use any products that contain nicotine, such as cigarettes, chewing tobacco, and e-cigarettes. If you need help quitting, ask your health care provider.  Do not drink alcoholic beverages. Alcohol is related to a number of birth defects.  Avoid toxic odors and chemicals.  You may continue to have sexual intercourse if it does not cause pain or other problems, such as vaginal bleeding. This information is not intended to replace advice given to you by your health care provider. Make sure you discuss any questions you have with your health care provider. Document Released: 07/09/2008 Document Revised: 03/24/2016 Document Reviewed: 02/16/2016 Elsevier Interactive Patient Education  2018 [employer] Inc[employer]  

## 2018-02-18 NOTE — Progress Notes (Signed)
25 yrs African American Divorced [id number] LMP 02/18/18.    Presents for Mirena removal.  Denies any vaginal symptoms or STD concerns.  Plans for contraception are condoms. Patient planning to try for pregnancy in 2020. Hopeful for marriage later this year. Patient requesting information regarding pregnancy planning at this time, until marriage occurs. May choose to not be sexually active until marriage. No other health issues today.  LMP7/12/19        HPI neg. [facility]DW[facility] female Orientation x 3  Affect normal  Exam:  Abdomen: soft non-tender Inguinal lymph noes:no enlargement, no tenderness    Pelvic exam:Pelvic exam: VULVA: normal appearing vulva with no masses, tenderness or lesions,  VAGINA: normal appearing vagina with normal color and discharge, no lesions, CERVIX: normal appearing cervix without discharge or lesions, IUD string noted in cervix with menses blood present  UTERUS: uterus is normal size, shape, consistency and nontender, history of intramural fibroids ADNEXA: normal adnexa in size, nontender and no masses limited body habitus RECTAL: rectal exam not indicated,  Procedure: Speculum placed, cervix visualized.  IUD string visualized, grasp with ring forceps, with gentle traction IUD removed intact.  IUD shown to patient. Patient request to keep her IUD. Cleaned and placed in zip lock bag and given to patient. Speculum removed.   Assessment:Mirena  IUD removal Pt tolerated procedure well. Possible planning of pregnancy in next year History of intramural fibroids not symptomatic AMA for pregnancy  Plan: Begin contraceptive choice of condoms or abstain. Discussed normal pelvic exam today. Discussed Rubella screen to check immune status. Patient agreeable.  Lab: Rubella TDAP due in next few months request today. Start on prenatal vitamins daily. Will review status with planning at next aex 08/2018. Questions addressed  Rv, prn, aex

## 2018-02-19 LAB — RUBELLA SCREEN: Rubella Antibodies, IGG: 5.4 index (ref >0.99)

## 2018-04-12 ENCOUNTER — Telehealth: Payer: Self-pay | Admitting: Certified Nurse Midwife

## 2018-04-12 DIAGNOSIS — Z3009 Encounter for other general counseling and advice on contraception: Secondary | ICD-10-CM

## 2018-04-12 NOTE — Telephone Encounter (Signed)
Return call to patient.   Last seen 02-18-18 for IUD removal to plan for pregnancy.  Left message to call back for triage nurse.

## 2018-04-12 NOTE — Telephone Encounter (Signed)
Patient called requesting to speak with the nurse to schedule IUD placement.

## 2018-04-12 NOTE — Telephone Encounter (Signed)
Call from patient. States no longer considering pregnancy and desires Mirena IUD.  LMP 04-03-18. Not sexually active since menses.  Advised will need to review with provider for order and insertion date as well as initiate precert and call her back.   Routing to [employer] FNP for review and orders.

## 2018-04-13 NOTE — Telephone Encounter (Signed)
Call to patient. Advised of instructions for Mirena insertion per French Ana, CNM.  Patient to call with onset of cycle. Instructed to use contraception until cycle and must be within first 5 days of menses.    Encounter closed.

## 2018-04-13 NOTE — Telephone Encounter (Signed)
She has history of intramural fibroids per chart noted prior to IUD insertion last time. I doubt these have changed due to no difficulty removed IUD.[name] Kitchen She will need to come in on period day 1-5 and use consistent contraception or not be sexually active at all prior to insertion.

## 2018-04-20 ENCOUNTER — Other Ambulatory Visit: Payer: Self-pay | Admitting: Certified Nurse Midwife

## 2018-04-20 NOTE — Telephone Encounter (Signed)
Patient requesting a refill for boric acid. Pharmacy is [facility] [phone]

## 2018-04-20 NOTE — Telephone Encounter (Signed)
Patient called regarding the status of her prescription refill request. I informed patient the request is still pending Regina Eck approval and to please allow 48 hours for refill request. I informed patient that Melvia Heaps is out of the office today. Patient asked if someone else could approve this request today?

## 2018-04-20 NOTE — Telephone Encounter (Signed)
Medication refill request: Boric acid Last AEX:  08/11/2017 Next AEX: 08/19/2018 Last MMG (if hormonal medication request): 09/2017 Refill authorized: Disp 500g, 5 refills.  Please advise.

## 2018-04-20 NOTE — Telephone Encounter (Signed)
Spoke with patient. Has used Rx for Boric acid PRN since last filled 09/04/16 from NP Grubb. Patient reports she had not needed to use Boric Acid because she did not have menses so she did not develop bacterial vaginosis symptoms.  She is not currently trying for pregnancy and plans to have Mirena reinserted.  LMP 04/08/18.   Reviewed with Dr. Quincy Simmonds. Pt will need an office visit to obtain treatment for bacterial vaginosis if symptomatic.   Message left to return call to [place] at [phone] for message and to schedule office visit.

## 2018-04-21 ENCOUNTER — Ambulatory Visit: Payer: BC Managed Care – PPO | Admitting: Certified Nurse Midwife

## 2018-04-21 ENCOUNTER — Other Ambulatory Visit: Payer: Self-pay

## 2018-04-21 ENCOUNTER — Encounter: Payer: Self-pay | Admitting: Certified Nurse Midwife

## 2018-04-21 VITALS — BP 104/68 | HR 68 | Resp 16 | Ht 65.75 in | Wt 225.0 lb

## 2018-04-21 DIAGNOSIS — Z8742 Personal history of other diseases of the female genital tract: Secondary | ICD-10-CM | POA: Diagnosis not present

## 2018-04-21 DIAGNOSIS — N898 Other specified noninflammatory disorders of vagina: Secondary | ICD-10-CM

## 2018-04-21 NOTE — Telephone Encounter (Signed)
Spoke with patient and office visit scheduled for today at 1245 with Melvia Heaps CNM.

## 2018-04-21 NOTE — Patient Instructions (Signed)

## 2018-04-21 NOTE — Telephone Encounter (Signed)
Patient is returning a call to Tracy. °

## 2018-04-21 NOTE — Progress Notes (Addendum)
42 y.o. Divorced [place] female [phone] here with complaint of vaginal symptoms of odorous increase discharge and ? Irritation. Used pads with this first cycle since removal of Mirena IUD. Onset of symptoms 2  days ago. Denies new personal products. No  STD concerns. Urinary symptoms denies any changes.Contraception is condoms for the first time in several years and wonders if this is there reason for irritation. Considering natural family planning for contraception. May consider another IUD due to history of dysmenorrhea. No other health issues today.  Review of Systems  Constitutional: Negative.   HENT: Negative.   Eyes: Negative.   Cardiovascular: Negative.   Gastrointestinal: Negative.   Genitourinary:       Vaginal discharge with odor  Musculoskeletal: Negative.   Skin: Negative.   Neurological: Negative.   Endo/Heme/Allergies: Negative.   Psychiatric/Behavioral: Negative.     O:Healthy female WDWN Affect: normal, orientation x 3  Exam:Skin: warm and dry Abdomen: soft, non tender, negative suprapubic  Inguinal Lymph nodes: no enlargement or tenderness Pelvic exam: External genital: normal female, no lesions or scaling or exudate BUS: negative Vagina: copious clear odorous Metrogel mixed discharge noted.  Affirm taken Cervix: normal, non tender, no CMT Uterus: normal, non tender Adnexa:normal, non tender, no masses or fullness noted  A:Normal pelvic exam R/O vaginal infection History of BV   P:Discussed findings of normal pelvic exam and vaginal discharge with Metrogel noted.  Discussed Aveeno or baking soda sitz bath for comfort until Affirm results back. Avoid moist clothes or pads for extended period of time. If working out in gym clothes or swim suits for long periods of time change underwear or bottoms of swimsuit if possible.Will advise is she decides on IUD again. Lab: Affirm   Rv prn

## 2018-04-22 ENCOUNTER — Other Ambulatory Visit: Payer: Self-pay

## 2018-04-22 LAB — VAGINITIS/VAGINOSIS, DNA PROBE
Candida Species: NEGATIVE
GARDNERELLA VAGINALIS: POSITIVE — AB
Trichomonas vaginosis: NEGATIVE

## 2018-04-22 MED ORDER — METRONIDAZOLE 0.75 % VA GEL: VAGINAL | 0 refills | Status: DC

## 2018-04-28 ENCOUNTER — Telehealth: Payer: Self-pay | Admitting: Certified Nurse Midwife

## 2018-04-28 NOTE — Telephone Encounter (Signed)
Reviewed with Melvia Heaps, CNM. Patient to complete Metrogel after menses. Call with next menses to schedule IUD insertion. Use BUM.   Call returned to patient, advised as seen above per Melvia Heaps, CNM. Patient verbalizes understanding.  Routing to provider for final review. Patient is agreeable to disposition. Will close encounter.

## 2018-04-28 NOTE — Telephone Encounter (Signed)
Spoke with patient. LMP 9/19. Currently using metrogel for BV, has one more day of metrogel left. Patient asking if she needs to schedule IUD now or wait?  Advised will review with Melvia Heaps, CNM and return call.

## 2018-04-28 NOTE — Telephone Encounter (Signed)
Patient has started her cycle and is ready for her IUD insertion. She has questions for a nurse before scheduling.

## 2018-05-18 ENCOUNTER — Telehealth: Payer: Self-pay | Admitting: Certified Nurse Midwife

## 2018-05-18 ENCOUNTER — Ambulatory Visit: Payer: BC Managed Care – PPO | Admitting: Certified Nurse Midwife

## 2018-05-18 ENCOUNTER — Encounter: Payer: Self-pay | Admitting: Certified Nurse Midwife

## 2018-05-18 ENCOUNTER — Other Ambulatory Visit: Payer: Self-pay

## 2018-05-18 VITALS — BP 114/70 | HR 68 | Resp 16 | Wt 227.0 lb

## 2018-05-18 DIAGNOSIS — N898 Other specified noninflammatory disorders of vagina: Secondary | ICD-10-CM

## 2018-05-18 DIAGNOSIS — T3695XA Adverse effect of unspecified systemic antibiotic, initial encounter: Secondary | ICD-10-CM | POA: Diagnosis not present

## 2018-05-18 DIAGNOSIS — B379 Candidiasis, unspecified: Secondary | ICD-10-CM | POA: Diagnosis not present

## 2018-05-18 MED ORDER — CLOBETASOL PROPIONATE 0.05 % EX OINT: TOPICAL_OINTMENT | CUTANEOUS | 0 refills | Status: DC

## 2018-05-18 NOTE — Progress Notes (Signed)
42 y.o. Divorced [place] female [phone] here with complaint of vaginal symptoms of itching, and increase discharge. Describes discharge as white thick with no odor. Notices by end of day. Started on Doxycycline for cellulitis, from Tattoo.. Onset of symptoms 3 days ago. Using tampons again with menses returning after IUD removal. No STD concerns. Urinary symptoms none . Contraception is condoms with slight irritation after use and may the issues with discharge change. No other health issues today.  Review of Systems  Constitutional: Negative.   HENT: Negative.   Eyes: Negative.   Respiratory: Negative.   Cardiovascular: Negative.   Gastrointestinal: Positive for vomiting.       From Doxycyline use with smoothie  Genitourinary: Negative.        Increase vaginal discharge  Skin: Negative.   Neurological: Negative.   Endo/Heme/Allergies: Negative.   Psychiatric/Behavioral: Negative.     O:Healthy female WDWN Affect: normal, orientation x 3  Exam: Skin warm and dry Abdomen: non tender  Inguinal Lymph nodes: no enlargement or tenderness Pelvic exam: External genital: normal female, no lesions or scaling or exudate BUS: negative Vagina: white, thick non odorous discharge noted., Affirm taken Cervix: normal, non tender, no CMT Uterus: normal, non tender Adnexa:normal, non tender, no masses or fullness noted   A:Normal pelvic exam R/O antibiotic induced vaginal infection Cellulitis from tattoo on Doxycycline   P:Discussed findings of normal pelvic exam. Discussed discharge and will treat if indicated by Affirm results. Discussed Aveeno or baking soda sitz bath for comfort.   Questions addressed. Lab: Affirm  Rv prn

## 2018-05-18 NOTE — Telephone Encounter (Signed)
Patient stated that she was treated for an infection and is not experiencing similar symptoms. Patient is wanting to speak with a nurse before making an appointment.

## 2018-05-18 NOTE — Telephone Encounter (Signed)
Spoke with patient. Patient treated for BV 9/13, menses started 3 days into tx, patient stopped tx and completed last 2 doses after menses. Reports symptoms of vaginal itching, irritation and thick, white clumpy vaginal d/c, no odor.   Recommended OV for further evaluation, OV scheduled for today at 2:30pm with Melvia Heaps, CNM. Patient verbalizes understanding and is agreeable. Encounter closed.

## 2018-05-18 NOTE — Patient Instructions (Signed)
Lichen Sclerosus Lichen sclerosus is a skin problem. It can happen on any part of the body. It happens most often in the anal or genital areas. It can cause itching and discomfort. Treatment can help to control symptoms. This skin problem is not passed from one person to another (not contagious). The cause is not known. Follow these instructions at home:  Take over-the-counter and prescription medicines only as told by your doctor.  Use creams or ointments as told by your doctor.  Do not scratch the affected areas of skin.  Women should keep the vagina as clean and dry as they can.  Keep all follow-up visits as told by your doctor. This is important. Contact a doctor if:  Your redness, swelling, or pain gets worse.  You have fluid, blood, or pus coming from the area.  You have new patches (lesions) on your skin.  You have a fever.  You have pain during sex. This information is not intended to replace advice given to you by your health care provider. Make sure you discuss any questions you have with your health care provider. Document Released: 07/09/2008 Document Revised: 01/02/2016 Document Reviewed: 10/22/2014 Elsevier Interactive Patient Education  2018 Elsevier Inc.  

## 2018-05-19 LAB — VAGINITIS/VAGINOSIS, DNA PROBE
Candida Species: NEGATIVE
Gardnerella vaginalis: NEGATIVE
TRICHOMONAS VAG: NEGATIVE

## 2018-05-25 ENCOUNTER — Telehealth: Payer: Self-pay | Admitting: Certified Nurse Midwife

## 2018-05-25 NOTE — Telephone Encounter (Signed)
Patient started period and calling to schedule iud insertion.

## 2018-05-25 NOTE — Telephone Encounter (Signed)
Spoke with patient. LMP 05/25/18, condoms for contraceptive. Requesting to schedule Mirena IUD insertion. Procedure scheduled for 05/27/18 at 1:30pm with Melvia Heaps, CNM. Advised to take Motrin 800 mg with food and water one hour before procedure.  Order previously placed for IUD insertion.  Routing to provider for final review. Patient is agreeable to disposition. Will close encounter.   Cc: Lerry Liner, [name]

## 2018-05-27 ENCOUNTER — Encounter: Payer: Self-pay | Admitting: Certified Nurse Midwife

## 2018-05-27 ENCOUNTER — Ambulatory Visit (INDEPENDENT_AMBULATORY_CARE_PROVIDER_SITE_OTHER): Payer: BC Managed Care – PPO | Admitting: Certified Nurse Midwife

## 2018-05-27 ENCOUNTER — Other Ambulatory Visit: Payer: Self-pay

## 2018-05-27 VITALS — BP 110/64 | HR 68 | Resp 16 | Wt 228.0 lb

## 2018-05-27 DIAGNOSIS — Z30430 Encounter for insertion of intrauterine contraceptive device: Secondary | ICD-10-CM

## 2018-05-27 DIAGNOSIS — Z01812 Encounter for preprocedural laboratory examination: Secondary | ICD-10-CM

## 2018-05-27 DIAGNOSIS — Z113 Encounter for screening for infections with a predominantly sexual mode of transmission: Secondary | ICD-10-CM

## 2018-05-27 DIAGNOSIS — Z3009 Encounter for other general counseling and advice on contraception: Secondary | ICD-10-CM

## 2018-05-27 LAB — POCT URINE PREGNANCY: PREG TEST UR: NEGATIVE

## 2018-05-27 NOTE — Patient Instructions (Signed)

## 2018-05-27 NOTE — Progress Notes (Signed)
53 yrs divorced [place] American female presents for  insertion of Mirena IUD.[name] Kitchen Denies any vaginal symptoms or STD concerns. Recent vaginal screen was negative. Will do GC/Chlamydia, prior to insertion. Patient has had Mirena IUD in past with no issues.[name] Kitchen  LMP 05/25/18  Patient read information regarding IUD insertion and Mirena IUD information..  All questions addressed. Patient request Mirena IUD insertion.    Physical Exam  Constitutional: She is oriented to person, place, and time. She appears well-developed and well-nourished.  Abdominal: Soft.  Genitourinary: Vagina normal and uterus normal. Pelvic exam was performed with patient supine. There is no rash, tenderness or lesion on the right labia. There is no rash, tenderness or lesion on the left labia. Uterus is not tender. Cervix exhibits no motion tenderness, no discharge and no friability. Right adnexum displays no mass, no tenderness and no fullness. Left adnexum displays no mass, no tenderness and no fullness.  Lymphadenopathy: No inguinal adenopathy noted on the right or left side.  Neurological: She is alert and oriented to person, place, and time.  Skin: Skin is warm and dry.  Psychiatric: She has a normal mood and affect. Her behavior is normal. Judgment normal.    Procedure:  Speculum inserted into vagina. Gc/Chlamydia specimen obtained and sent to lab. Cervix visualized and cleansed with betadine solution X 3. Tenaculum placed on cervix at 2 and 10 o'clock position(s).  Uterus sounded to 7 centimeters.  IUD removed from sterile packet and under sterile conditions inserted to fundus of uterus.  Introducer removed without difficulty.  IUD string trimmed to 3 centimeters.  Remainder string given to patient to feel for identification.  Tenaculum removed.  minute amount of bleeding noted, resolved with pressure only . No active bleeding from site noted. Speculum removed.  Uterus palpated normal.  Patient tolerated procedure well and denied  problems. Patient BP rechecked at 110/70. Ambulated without problems.  A: Insertion of Mirena, Lot # [id number], Expiration date Nov. 2021   P:  Instructions and warnings signs given and need to call if occurring. Printed instructions also given.       IUD identification card  with IUD removal 05/28/2023 given to patient.Questions addressed. Patient walked to checkout with no concerns.        Return visit one month

## 2018-05-31 LAB — GC/CHLAMYDIA PROBE AMP
Chlamydia trachomatis, NAA: NEGATIVE
Neisseria gonorrhoeae by PCR: NEGATIVE

## 2018-06-09 ENCOUNTER — Other Ambulatory Visit: Payer: Self-pay

## 2018-06-09 ENCOUNTER — Encounter: Payer: Self-pay | Admitting: Certified Nurse Midwife

## 2018-06-09 ENCOUNTER — Ambulatory Visit: Payer: BC Managed Care – PPO | Admitting: Certified Nurse Midwife

## 2018-06-09 ENCOUNTER — Telehealth: Payer: Self-pay | Admitting: Certified Nurse Midwife

## 2018-06-09 VITALS — BP 110/70 | HR 70 | Resp 16 | Wt 227.0 lb

## 2018-06-09 DIAGNOSIS — N898 Other specified noninflammatory disorders of vagina: Secondary | ICD-10-CM

## 2018-06-09 NOTE — Telephone Encounter (Signed)
Spoke with patient. Patient reports vaginal odor after menses, treated with metrogel after menses in the past. Last vaginitis panel neg 05/18/18, positive for BV 9/12 . Mirena IUD inserted 05/27/18. Denies any other symptoms.   Recommended OV for further evaluation, OV scheduled for today at 1:45pm with Melvia Heaps, CNM.   Patient verbalizes understanding and is agreeable. Encounter closed.

## 2018-06-09 NOTE — Progress Notes (Signed)
42 y.o. Divorced [place] female [phone] here with complaint of vaginal symptoms of itching, burning, and increase discharge. Describes discharge as slightly odorous with white color. Onset of symptoms 3 days ago after normal period. Had this to occur previously after periods. Denies new personal products or vaginal dryness. Has not noted any other issues. No STD concerns, last screening with IUD insertion.. Urinary symptoms none.  Contraception is Mirena IUD.  Review of Systems  Constitutional: Negative.   HENT: Negative.   Eyes: Negative.   Respiratory: Negative.   Cardiovascular: Negative.   Gastrointestinal: Negative.   Genitourinary:       Vaginal odor  Musculoskeletal: Negative.   Skin: Negative.   Neurological: Negative.   Endo/Heme/Allergies: Negative.   Psychiatric/Behavioral: Negative.     O:Healthy female WDWN Affect: normal, orientation x 3  Exam:Skin: warm and dry Abdomen: soft, non tender Inguina lLymph nodes: no enlargement or tenderness Pelvic exam: External genital: normal female, no scaling or exudate or lesions BUS: negative Vagina: scant white slightly odorous discharge noted.     Affirm taken Cervix: normal, non tender, no CMT, IUD string noted in cervix Uterus: normal, non tender Adnexa:normal, non tender, no masses or fullness noted   A:Normal pelvic exam Contraception Mirena IUD recent insertion, normal surveillance R/O vaginal  infection   P:Discussed findings of slightly odorous white discharge.. Discussed Aveeno or baking soda sitz bath for comfort. Discussed if continues after each period will discuss possible boric acid use monthly. Questions addressed. Aware of warning signs with IUD. Lab: Affirm will treat indicated    Rv prn

## 2018-06-09 NOTE — Telephone Encounter (Signed)
Patient called requesting to speak with the nurse about having an odor after finishing her cycle. She declined an appointment at this time.

## 2018-06-10 ENCOUNTER — Other Ambulatory Visit: Payer: Self-pay

## 2018-06-10 LAB — VAGINITIS/VAGINOSIS, DNA PROBE
Candida Species: NEGATIVE
GARDNERELLA VAGINALIS: POSITIVE — AB
TRICHOMONAS VAG: NEGATIVE

## 2018-06-10 MED ORDER — TINIDAZOLE 500 MG PO TABS: 500.0000 mg | ORAL_TABLET | Freq: Two times a day (BID) | ORAL | 0 refills | Status: DC

## 2018-06-28 ENCOUNTER — Telehealth: Payer: Self-pay | Admitting: Certified Nurse Midwife

## 2018-06-28 ENCOUNTER — Encounter: Payer: Self-pay | Admitting: Certified Nurse Midwife

## 2018-06-28 ENCOUNTER — Ambulatory Visit: Payer: BC Managed Care – PPO | Admitting: Certified Nurse Midwife

## 2018-06-28 NOTE — Telephone Encounter (Signed)
Patient states she has an emergency and cannot make it to today's appointment. Rescheduled to 07/05/18.

## 2018-07-05 ENCOUNTER — Encounter: Payer: Self-pay | Admitting: Certified Nurse Midwife

## 2018-07-05 ENCOUNTER — Ambulatory Visit: Payer: BC Managed Care – PPO | Admitting: Certified Nurse Midwife

## 2018-07-05 NOTE — Telephone Encounter (Signed)
Left message on voicemail regarding missed appointment.

## 2018-07-20 ENCOUNTER — Encounter: Payer: Self-pay | Admitting: Certified Nurse Midwife

## 2018-07-20 ENCOUNTER — Ambulatory Visit: Payer: BC Managed Care – PPO | Admitting: Certified Nurse Midwife

## 2018-07-20 VITALS — BP 120/72 | HR 80 | Resp 16 | Wt 226.0 lb

## 2018-07-20 DIAGNOSIS — Z113 Encounter for screening for infections with a predominantly sexual mode of transmission: Secondary | ICD-10-CM

## 2018-07-20 DIAGNOSIS — N898 Other specified noninflammatory disorders of vagina: Secondary | ICD-10-CM

## 2018-07-20 DIAGNOSIS — Z30431 Encounter for routine checking of intrauterine contraceptive device: Secondary | ICD-10-CM | POA: Diagnosis not present

## 2018-07-20 NOTE — Progress Notes (Signed)
42 y.o. Divorced [place] female [phone] here with complaint of vaginal symptoms of itching, burning, and increase discharge. Describes discharge as none. Onset of symptoms 2 days ago. Denies new personal products or vaginal dryness. Also here for follow up with IUD. Nothing but spotting x 1 after insertion of Mirena IUD and happy with choice. Denies any problems with use. No STD concerns, however patient states that she has a new partner and would like to have STD screening.. No urinary symptoms. Contraception is IUD working well, no warning signs noted. No other health issues today.  Review of Systems  Constitutional: Negative.   HENT: Negative.   Eyes: Negative.   Respiratory: Negative.   Cardiovascular: Negative.   Gastrointestinal: Negative.   Genitourinary:       Vaginal itching  Musculoskeletal: Negative.   Skin: Negative.   Neurological: Negative.   Endo/Heme/Allergies: Negative.   Psychiatric/Behavioral: Negative.     O:Healthy female WDWN Affect: normal, orientation x 3  Exam:Skin: warm and dry Abdomen: soft, non tender  Inguinal Lymph node: no enlargement or tenderness Pelvic exam: External genital: normal female BUS: negative Vagina: white thick slightly odorous discharge noted.    , Affirm taken Cervix: normal, non tender, no CMT IUD string in cervix appropriate length Uterus: normal, non tender Adnexa:normal, non tender, no masses or fullness noted   A:Normal pelvic exam Normal Mirena IUD surveillance R/O vaginal infection STD screening vaginal only   P:Discussed findings of normal pelvic exam and IUD check. Discussed Aveeno or baking soda sitz bath for comfort, with vaginal discharge. Discussed condom use for STD protection also. Labs: Affirm, Gc/Chlamydia  Rv prn

## 2018-07-21 ENCOUNTER — Other Ambulatory Visit: Payer: Self-pay

## 2018-07-21 LAB — VAGINITIS/VAGINOSIS, DNA PROBE
CANDIDA SPECIES: POSITIVE — AB
GARDNERELLA VAGINALIS: POSITIVE — AB
TRICHOMONAS VAG: NEGATIVE

## 2018-07-21 MED ORDER — METRONIDAZOLE 0.75 % VA GEL: 1.0000 | Freq: Two times a day (BID) | VAGINAL | 0 refills | Status: AC

## 2018-07-21 MED ORDER — FLUCONAZOLE 150 MG PO TABS: ORAL_TABLET | ORAL | 0 refills | Status: DC

## 2018-07-22 LAB — GC/CHLAMYDIA PROBE AMP
Chlamydia trachomatis, NAA: NEGATIVE
NEISSERIA GONORRHOEAE BY PCR: NEGATIVE

## 2018-08-01 ENCOUNTER — Other Ambulatory Visit: Payer: Self-pay | Admitting: Certified Nurse Midwife

## 2018-08-01 DIAGNOSIS — Z1231 Encounter for screening mammogram for malignant neoplasm of breast: Secondary | ICD-10-CM

## 2018-08-19 ENCOUNTER — Other Ambulatory Visit: Payer: Self-pay

## 2018-08-19 ENCOUNTER — Encounter: Payer: Self-pay | Admitting: Certified Nurse Midwife

## 2018-08-19 ENCOUNTER — Ambulatory Visit (INDEPENDENT_AMBULATORY_CARE_PROVIDER_SITE_OTHER): Payer: BC Managed Care – PPO | Admitting: Certified Nurse Midwife

## 2018-08-19 ENCOUNTER — Other Ambulatory Visit (HOSPITAL_COMMUNITY)
Admission: RE | Admit: 2018-08-19 | Discharge: 2018-08-19 | Disposition: A | Discharge status: Home / Self Care | Payer: BC Managed Care – PPO | Source: Ambulatory Visit | Attending: Certified Nurse Midwife | Admitting: Certified Nurse Midwife

## 2018-08-19 VITALS — BP 110/70 | HR 68 | Resp 16 | Ht 66.25 in | Wt 224.0 lb

## 2018-08-19 DIAGNOSIS — E559 Vitamin D deficiency, unspecified: Secondary | ICD-10-CM | POA: Diagnosis not present

## 2018-08-19 DIAGNOSIS — R6889 Other general symptoms and signs: Secondary | ICD-10-CM | POA: Diagnosis not present

## 2018-08-19 DIAGNOSIS — Z124 Encounter for screening for malignant neoplasm of cervix: Secondary | ICD-10-CM

## 2018-08-19 DIAGNOSIS — Z01419 Encounter for gynecological examination (general) (routine) without abnormal findings: Secondary | ICD-10-CM

## 2018-08-19 DIAGNOSIS — Z113 Encounter for screening for infections with a predominantly sexual mode of transmission: Secondary | ICD-10-CM

## 2018-08-19 NOTE — Progress Notes (Signed)
43 y.o. [id number] Divorced  African American Fe here for annual exam.  Periods none with IUD and no issues with IUD. New partner (friends prior) but had STD screening previously, but would like to do serum screening.  None needed today. Working on healthy weight loss now. No other health issues today  No LMP recorded. (Menstrual status: IUD).          Sexually active: Yes.    The current method of family planning is IUD.    Exercising: No.  exercise Smoker:  no  Review of Systems  Constitutional: Negative.   HENT: Negative.   Eyes: Negative.   Respiratory: Negative.   Cardiovascular: Negative.   Gastrointestinal: Negative.   Genitourinary: Negative.   Musculoskeletal: Negative.   Skin: Negative.   Neurological: Negative.   Endo/Heme/Allergies: Negative.   Psychiatric/Behavioral: Negative.     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 She has never used smokeless tobacco. She reports current alcohol use of about 1.0 - 2.0 standard drinks of alcohol per week. She reports that she does not use drugs.  Past Medical History:  Diagnosis Date  . Abnormal Pap smear of cervix 2006  . Allergy   . Anxiety   . Asthma   . Depression   . Headache   . History of migraine   . Hx of migraines   . STD (sexually transmitted disease) 20 years ago   [place]    Past Surgical History:  Procedure Laterality Date  . APPENDECTOMY  01/25/2017  . BREAST BIOPSY     x2  . BREAST EXCISIONAL BIOPSY Bilateral    Scars are within reducion scars   . BREAST REDUCTION SURGERY  2003    H to DD  . COLPOSCOPY     2006 or 2007  . INTRAUTERINE DEVICE (IUD) INSERTION  11/27/2013   11-27-13 Mirena inserted & removed 02-18-18, 05-27-18 mirena iud inserted  . 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 LAPAROSCOPIC APPENDECTOMY N/A 01/25/2017   Procedure: APPENDECTOMY LAPAROSCOPIC;  Surgeon: Greer Pickerel, MD;  Location: [facility];  Service: General;  Laterality: N/A;  . REDUCTION MAMMAPLASTY Bilateral     Current Outpatient Medications  Medication Sig Dispense Refill  . levonorgestrel (MIRENA) 20 MCG/24HR IUD 1 each by Intrauterine route once.    . valACYclovir (VALTREX) 1000 MG tablet Two tablets bid x 1 day at onset of oral outbreaks 30 tablet 1   No current facility-administered medications for this visit.     Family History  Problem Relation Age of Onset  . Alcohol abuse Mother   . Alcohol abuse Father   . Drug abuse Father   . Depression Father   . Bipolar disorder Father   . Diabetes Father   . Breast cancer Maternal Grandmother     ROS:  Pertinent items are noted in HPI.  Otherwise, a comprehensive ROS was negative.  Exam:   BP 110/70   Pulse 68   Resp 16   Ht 5' 6.25" (1.683 m)   Wt 224 lb (101.6 kg)   BMI 35.88 kg/m  Height: 5' 6.25" (168.3 cm) Ht Readings  from Last 3 Encounters:  08/19/18 5' 6.25" (1.683 m)  04/21/18 5' 5.75" (1.67 m)  02/18/18 5' 5.75" (1.67 m)    General appearance: alert, cooperative and appears stated age Head: Normocephalic, without obvious abnormality, atraumatic Neck: no adenopathy, supple, symmetrical, trachea midline and thyroid normal to inspection and palpation Lungs: clear to auscultation bilaterally Breasts: normal appearance, no masses or tenderness, No nipple retraction or dimpling, No nipple discharge or bleeding, No axillary or supraclavicular adenopathy Heart: regular rate and rhythm Abdomen: soft, non-tender; no masses,  no organomegaly Extremities: extremities normal, atraumatic, no cyanosis or edema Skin: Skin color, texture, turgor normal. No rashes or lesions Lymph nodes: Cervical, supraclavicular, and  axillary nodes normal. No abnormal inguinal nodes palpated Neurologic: Grossly normal   Pelvic: External genitalia:  no lesions              Urethra:  normal appearing urethra with no masses, tenderness or lesions              Bartholin's and Skene's: normal                 Vagina: normal appearing vagina with normal color and discharge, no lesions              Cervix: no cervical motion tenderness, no lesions and IUD noted in cervix              Pap taken: Yes.   Bimanual Exam:  Uterus:  normal size, contour, position, consistency, mobility, non-tender and anteverted              Adnexa: normal adnexa and no mass, fullness, tenderness               Rectovaginal: Confirms               Anus:  normal sphincter tone, no lesions  Chaperone present: yes  A:  Well Woman with normal exam  Contraception Mirena IUD due for removal 05/28/2023  Overweight working on weight loss  Screening labs  P:   Reviewed health and wellness pertinent to exam  Aware of warning signs with IUD and to advise if occurs.  Encouraged to continue her weight loss journey for better health.  Labs: Vitamin D, Lipid panel, CMP, Hep C STD panel  Pap smear: yes   counseled on breast self exam, mammography screening, STD prevention, HIV risk factors and prevention, adequate intake of calcium and vitamin D, diet and exercise  return annually or prn  An After Visit Summary was printed and given to the patient.

## 2018-08-20 LAB — COMPREHENSIVE METABOLIC PANEL
A/G RATIO: 1.5 (ref 1.2–2.2)
ALT: 13 IU/L (ref 0–32)
AST: 14 IU/L (ref 0–40)
Albumin: 4.1 g/dL (ref 3.5–5.5)
Alkaline Phosphatase: 61 IU/L (ref 39–117)
BILIRUBIN TOTAL: 0.3 mg/dL (ref 0.0–1.2)
BUN/Creatinine Ratio: 12 (ref 9–23)
BUN: 9 mg/dL (ref 6–24)
CALCIUM: 9.6 mg/dL (ref 8.7–10.2)
CHLORIDE: 103 mmol/L (ref 96–106)
CO2: 19 mmol/L — ABNORMAL LOW (ref 20–29)
Creatinine, Ser: 0.76 mg/dL (ref 0.57–1.00)
GFR calc non Af Amer: 98 mL/min/{1.73_m2} (ref >59)
GFR, EST AFRICAN AMERICAN: 113 mL/min/{1.73_m2} (ref >59)
GLUCOSE: 71 mg/dL (ref 65–99)
Globulin, Total: 2.8 g/dL (ref 1.5–4.5)
POTASSIUM: 4 mmol/L (ref 3.5–5.2)
Sodium: 141 mmol/L (ref 134–144)
TOTAL PROTEIN: 6.9 g/dL (ref 6.0–8.5)

## 2018-08-20 LAB — VITAMIN D 25 HYDROXY (VIT D DEFICIENCY, FRACTURES): Vit D, 25-Hydroxy: 12.5 ng/mL — ABNORMAL LOW (ref 30.0–100.0)

## 2018-08-20 LAB — LIPID PANEL
Chol/HDL Ratio: 4.1 ratio (ref 0.0–4.4)
Cholesterol, Total: 201 mg/dL — ABNORMAL HIGH (ref 100–199)
HDL: 49 mg/dL (ref >39)
LDL CALC: 132 mg/dL — AB (ref 0–99)
TRIGLYCERIDES: 98 mg/dL (ref 0–149)
VLDL Cholesterol Cal: 20 mg/dL (ref 5–40)

## 2018-08-20 LAB — HEP, RPR, HIV PANEL
HIV SCREEN 4TH GENERATION: NONREACTIVE
Hepatitis B Surface Ag: NEGATIVE
RPR: NONREACTIVE

## 2018-08-20 LAB — HEPATITIS C ANTIBODY: Hep C Virus Ab: <0.1 s/co ratio (ref 0.0–0.9)

## 2018-08-21 ENCOUNTER — Other Ambulatory Visit: Payer: Self-pay | Admitting: Certified Nurse Midwife

## 2018-08-21 DIAGNOSIS — E559 Vitamin D deficiency, unspecified: Secondary | ICD-10-CM

## 2018-08-22 LAB — CYTOLOGY - PAP
Diagnosis: NEGATIVE
HPV: NOT DETECTED

## 2018-08-23 ENCOUNTER — Other Ambulatory Visit: Payer: Self-pay

## 2018-08-23 MED ORDER — VITAMIN D (ERGOCALCIFEROL) 1.25 MG (50000 UNIT) PO CAPS: 50000.0000 [IU] | ORAL_CAPSULE | ORAL | 0 refills | Status: DC

## 2018-09-02 ENCOUNTER — Ambulatory Visit: Payer: BC Managed Care – PPO | Admitting: Certified Nurse Midwife

## 2018-09-15 ENCOUNTER — Ambulatory Visit
Admission: RE | Admit: 2018-09-15 | Discharge: 2018-09-15 | Disposition: A | Discharge status: Home / Self Care | Payer: BC Managed Care – PPO | Source: Ambulatory Visit | Attending: Certified Nurse Midwife | Admitting: Certified Nurse Midwife

## 2018-09-15 DIAGNOSIS — Z1231 Encounter for screening mammogram for malignant neoplasm of breast: Secondary | ICD-10-CM

## 2018-11-03 ENCOUNTER — Ambulatory Visit (INDEPENDENT_AMBULATORY_CARE_PROVIDER_SITE_OTHER): Payer: BC Managed Care – PPO | Admitting: Obstetrics & Gynecology

## 2018-11-03 ENCOUNTER — Encounter: Payer: Self-pay | Admitting: Obstetrics & Gynecology

## 2018-11-03 VITALS — BP 122/76 | HR 80 | Temp 98.2°F | Resp 16 | Ht 66.25 in | Wt 226.4 lb

## 2018-11-03 DIAGNOSIS — Z113 Encounter for screening for infections with a predominantly sexual mode of transmission: Secondary | ICD-10-CM

## 2018-11-03 DIAGNOSIS — R300 Dysuria: Secondary | ICD-10-CM

## 2018-11-03 LAB — POCT URINALYSIS DIPSTICK
Bilirubin, UA: NEGATIVE
Blood, UA: NEGATIVE
GLUCOSE UA: NEGATIVE
Ketones, UA: NEGATIVE
Leukocytes, UA: NEGATIVE
Nitrite, UA: NEGATIVE
Protein, UA: NEGATIVE
Urobilinogen, UA: 0.2 E.U./dL
pH, UA: 7 (ref 5.0–8.0)

## 2018-11-03 MED ORDER — FLUCONAZOLE 150 MG PO TABS: ORAL_TABLET | ORAL | 0 refills | Status: DC

## 2018-11-03 MED ORDER — SULFAMETHOXAZOLE-TRIMETHOPRIM 800-160 MG PO TABS: 1.0000 | ORAL_TABLET | Freq: Two times a day (BID) | ORAL | 0 refills | Status: DC

## 2018-11-03 NOTE — Progress Notes (Signed)
GYNECOLOGY  VISIT  CC:   UTI symptoms  HPI: 43 y.o. G57P2002 Divorced Other or two or more races female here for urgency, frequency, dysuria x 3 days.  Reports she knows what they feel like and feels like this is a UTI.  Denies hematuria, fever, back pain, pelvic pain.    Has been with current partner for about six months.  When asked, feels this is not necessary but given symptoms and negative urine dip in office, feel this may be helpful.  We agreed if urine culture was negative. Then would proceed with STD testing.  She is in agreement.  Has Mirena IUD for contraception and for bleeding/pain control.  Does not typically cycle.  GYNECOLOGIC HISTORY: No LMP recorded. (Menstrual status: IUD). Contraception: Mirena IUD placed 05/27/18 Menopausal hormone therapy: none  Patient Active Problem List   Diagnosis Date Noted  . Appendicitis 01/25/2017  . Anxiety and depression 01/21/2017  . Fibroid uterus 09/15/2013  . History of bilateral breast reduction surgery 06/16/2013  . Migraine with aura 06/16/2013    Past Medical History:  Diagnosis Date  . Abnormal Pap smear of cervix 2006  . Allergy   . Anxiety   . Asthma   . Depression   . Headache   . History of migraine   . Hx of migraines   . STD (sexually transmitted disease) 20 years ago   [place]    Past Surgical History:  Procedure Laterality Date  . APPENDECTOMY  01/25/2017  . BREAST BIOPSY     x2  . BREAST EXCISIONAL BIOPSY Bilateral    Scars are within reducion scars   . BREAST REDUCTION SURGERY  2003    H to DD  . COLPOSCOPY     2006 or 2007  . INTRAUTERINE DEVICE (IUD) INSERTION  11/27/2013   11-27-13 Mirena inserted & removed 02-18-18, 05-27-18 mirena iud inserted  . 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 LAPAROSCOPIC APPENDECTOMY N/A 01/25/2017   Procedure: APPENDECTOMY LAPAROSCOPIC;  Surgeon: Greer Pickerel, MD;  Location: [facility];  Service: General;  Laterality: N/A;  . REDUCTION MAMMAPLASTY Bilateral     MEDS:    Current Outpatient Medications on File Prior to Visit  Medication Sig Dispense Refill  . levonorgestrel (MIRENA) 20 MCG/24HR IUD 1 each by Intrauterine route once.    . valACYclovir (VALTREX) 1000 MG tablet Two tablets bid x 1 day at onset of oral outbreaks 30 tablet 1  . Vitamin D, Ergocalciferol, (DRISDOL) 1.25 MG (50000 UT) CAPS capsule Take 1 capsule (50,000 Units total) by mouth every 7 (seven) days. 12 capsule 0   No current facility-administered medications on file prior to visit.     ALLERGIES: Amoxil [amoxicillin]; Banana; Kiwi extract; and Soy allergy  Family History  Problem Relation Age of Onset  . Alcohol abuse Mother   . Alcohol abuse Father   . Drug abuse Father   . Depression Father   . Bipolar disorder Father   . Diabetes Father   . Breast cancer Maternal Grandmother     SH:  Divorced but with a significant other at this time, non smoker  Review of Systems  Genitourinary: Positive for dysuria, frequency and urgency.  All other systems reviewed and are negative.   PHYSICAL EXAMINATION:    BP 122/76   Pulse 80   Temp 98.2 F (36.8 C) (Oral)   Resp 16   Ht 5' 6.25" (1.683 m)   Wt 226 lb 6.4 oz (102.7 kg)  BMI 36.27 kg/m     General appearance: alert, cooperative and appears stated age Flank:  No CVA tenderness Abdomen: soft, non-tender; bowel sounds normal; no masses,  no organomegaly Lymph:  no inguinal LAD noted  Pelvic: External genitalia:  no lesions              Urethra:  normal appearing urethra with no masses, tenderness or lesions              Bartholins and Skenes: normal                 Vagina: normal appearing vagina with normal color and discharge, no lesions              Cervix: no lesions, IUD string noted              Bimanual Exam:  Uterus:  normal size, contour, position, consistency, mobility, non-tender              Adnexa: no mass, fullness, tenderness  Chaperone was present for exam.  Assessment: Dysuria, urgency and  urinary frequency With current partner x 6 months  Plan: Urine culture and micro pending Bactrim DS BID X 3 days Diflucan 150mg  po x 1, repeat in 72 hours if needed Aptima swab obtained and held to be sent in urine culture is negative.

## 2018-11-04 LAB — URINALYSIS, MICROSCOPIC ONLY
Bacteria, UA: NONE SEEN
Casts: NONE SEEN /lpf
Epithelial Cells (non renal): >10 /hpf — AB (ref 0–10)

## 2018-11-05 LAB — URINE CULTURE

## 2018-11-06 ENCOUNTER — Encounter: Payer: Self-pay | Admitting: Obstetrics & Gynecology

## 2018-11-08 NOTE — Addendum Note (Signed)
Addended by: Dorothy Spark on: 11/08/2018 10:14 AM   Modules accepted: Orders

## 2018-11-10 LAB — CHLAMYDIA/GONOCOCCUS/TRICHOMONAS, NAA
Chlamydia by NAA: NEGATIVE
Gonococcus by NAA: NEGATIVE
Trich vag by NAA: NEGATIVE

## 2018-11-14 ENCOUNTER — Other Ambulatory Visit: Payer: Self-pay | Admitting: Certified Nurse Midwife

## 2018-11-17 ENCOUNTER — Other Ambulatory Visit: Payer: BC Managed Care – PPO

## 2018-12-30 ENCOUNTER — Ambulatory Visit: Payer: BC Managed Care – PPO | Admitting: Obstetrics & Gynecology

## 2018-12-30 ENCOUNTER — Encounter: Payer: Self-pay | Admitting: Obstetrics & Gynecology

## 2018-12-30 ENCOUNTER — Other Ambulatory Visit: Payer: Self-pay

## 2018-12-30 ENCOUNTER — Telehealth: Payer: Self-pay | Admitting: Certified Nurse Midwife

## 2018-12-30 VITALS — BP 110/80 | HR 84 | Temp 98.0°F | Ht 66.25 in | Wt 231.0 lb

## 2018-12-30 DIAGNOSIS — E559 Vitamin D deficiency, unspecified: Secondary | ICD-10-CM

## 2018-12-30 DIAGNOSIS — R8290 Unspecified abnormal findings in urine: Secondary | ICD-10-CM

## 2018-12-30 DIAGNOSIS — Z0184 Encounter for antibody response examination: Secondary | ICD-10-CM

## 2018-12-30 LAB — POCT URINALYSIS DIPSTICK
Bilirubin, UA: NEGATIVE
Glucose, UA: NEGATIVE
Ketones, UA: NEGATIVE
Nitrite, UA: NEGATIVE
Protein, UA: POSITIVE — AB
Urobilinogen, UA: NEGATIVE E.U./dL — AB
pH, UA: 5 (ref 5.0–8.0)

## 2018-12-30 MED ORDER — HYDROCODONE-ACETAMINOPHEN 5-325 MG PO TABS: 1.0000 | ORAL_TABLET | Freq: Four times a day (QID) | ORAL | 0 refills | Status: AC | PRN

## 2018-12-30 MED ORDER — VITAMIN D (ERGOCALCIFEROL) 1.25 MG (50000 UNIT) PO CAPS: 50000.0000 [IU] | ORAL_CAPSULE | ORAL | 2 refills | Status: DC

## 2018-12-30 MED ORDER — IBUPROFEN 800 MG PO TABS: 800.0000 mg | ORAL_TABLET | Freq: Three times a day (TID) | ORAL | 0 refills | Status: DC | PRN

## 2018-12-30 MED ORDER — HYDROCODONE-ACETAMINOPHEN 5-325 MG PO TABS: 1.0000 | ORAL_TABLET | Freq: Four times a day (QID) | ORAL | 0 refills | Status: DC | PRN

## 2018-12-30 NOTE — Progress Notes (Signed)
GYNECOLOGY  VISIT  CC:   Pain with intercourse   HPI: 43 y.o. G4P2002 Divorced Other or two or more races female here for achy LLQ pain that seems to be present more when she gets up in the morning or shifts in bed.  This has been present for three days.  She has noted some increase odor with urination present the last three days as well.  She is not having dysuria, urgency or frequency.  Denies hematuria.  Pain does have some association through to lower back.  Does not have cycle due to Mirena IUD.    She's noted increased pain with intercourse that feels sharp as well.  She's only noted this the past two or three days.    Denies fever, vaginal bleeding or vaginal odor.  Separate issues she wants to discuss.  Pt has 2 1/2 week flu like illness in late March, early April.  Her son had it first.  She did not qualify for Covid testing and would like to consider antibody testing.  Reviewed possible questions associated with antibody testing that have yet to be fully answered.  She is ok with this.  Also needs Vit D testing today as well.    GYNECOLOGIC HISTORY: No LMP recorded. (Menstrual status: IUD). Contraception: IUD Menopausal hormone therapy: none poct urine-wbc 1+, rbc tr, protein +  Patient Active Problem List   Diagnosis Date Noted  . Appendicitis 01/25/2017  . Anxiety and depression 01/21/2017  . Fibroid uterus 09/15/2013  . History of bilateral breast reduction surgery 06/16/2013  . Migraine with aura 06/16/2013    Past Medical History:  Diagnosis Date  . Abnormal Pap smear of cervix 2006  . Allergy   . Anxiety   . Asthma   . Depression   . Headache   . History of migraine   . Hx of migraines   . STD (sexually transmitted disease) 20 years ago   [place]    Past Surgical History:  Procedure Laterality Date  . APPENDECTOMY  01/25/2017  . BREAST BIOPSY     x2  . BREAST EXCISIONAL BIOPSY Bilateral    Scars are within reducion scars   . BREAST REDUCTION SURGERY   2003    H to DD  . COLPOSCOPY     2006 or 2007  . INTRAUTERINE DEVICE (IUD) INSERTION  11/27/2013   11-27-13 Mirena inserted & removed 02-18-18, 05-27-18 mirena iud inserted  . 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 LAPAROSCOPIC APPENDECTOMY N/A 01/25/2017   Procedure: APPENDECTOMY LAPAROSCOPIC;  Surgeon: Greer Pickerel, MD;  Location: [facility];  Service: General;  Laterality: N/A;  . REDUCTION MAMMAPLASTY Bilateral     MEDS:   Current Outpatient Medications on File Prior to Visit  Medication Sig Dispense Refill  . levonorgestrel (MIRENA) 20 MCG/24HR IUD 1 each by Intrauterine route once.    . valACYclovir (VALTREX) 1000 MG tablet Two tablets bid x 1 day at onset of oral outbreaks 30 tablet 1   No current facility-administered medications on file prior to visit.     ALLERGIES: Amoxil [amoxicillin]; Banana; Kiwi extract; and Soy allergy  Family History  Problem Relation Age of Onset  . Alcohol abuse Mother   . Alcohol abuse Father   . Drug abuse Father   . Depression Father   . Bipolar disorder Father   . Diabetes Father   . Breast cancer Maternal Grandmother     SH:  Divorced, non smoker  Review of Systems  Genitourinary: Positive for dyspareunia and pelvic pain.  Musculoskeletal: Positive for back pain.    PHYSICAL EXAMINATION:    BP 110/80   Pulse 84   Temp 98 F (36.7 C) (Temporal)   Ht 5' 6.25" (1.683 m)   Wt 231 lb (104.8 kg)   BMI 37.00 kg/m     General appearance: alert, cooperative and appears stated age CV:  Regular rate and rhythm Lungs:  clear to auscultation, no wheezes, rales or rhonchi, symmetric air entry Abdomen: soft, non-tender; bowel sounds normal; no masses,  no organomegaly Lymph:  no inguinal LAD noted Psych: A&O x 3 Skin: normal color, no edema  Pelvic: External genitalia:  no lesions              Urethra:  normal appearing urethra with no masses, tenderness or lesions              Bartholins and Skenes: normal                 Vagina:  normal appearing vagina with normal color and discharge, no lesions              Cervix: no lesions, IUD string noted              Bimanual Exam:  Uterus:  normal size, contour, position, consistency, mobility, non-tender              Adnexa: no masses but mild left adnexal tenderness noted  Chaperone was present for exam.  Assessment: LLQ pain consistent with ovarian cyst Abnormal dip u/a but symptoms not consistent with UTI  Plan: Urine culture and micro pending Vit D obtained today Covid IgG testing obtained Pt given precautions for calling with worsening signs/symptoms.   If not improved, will consider PUS. Rx for short course of vicodin if needed for pain

## 2018-12-30 NOTE — Telephone Encounter (Signed)
VOID

## 2018-12-31 LAB — URINALYSIS, MICROSCOPIC ONLY
Casts: NONE SEEN /lpf
Epithelial Cells (non renal): >10 /hpf — AB (ref 0–10)
WBC, UA: >30 /hpf — AB (ref 0–5)

## 2019-01-01 ENCOUNTER — Encounter: Payer: Self-pay | Admitting: Obstetrics & Gynecology

## 2019-01-01 MED ORDER — SULFAMETHOXAZOLE-TRIMETHOPRIM 800-160 MG PO TABS: 1.0000 | ORAL_TABLET | Freq: Two times a day (BID) | ORAL | 0 refills | Status: DC

## 2019-01-02 ENCOUNTER — Telehealth: Payer: Self-pay | Admitting: Obstetrics & Gynecology

## 2019-01-02 LAB — SAR COV2 SEROLOGY (COVID19)AB(IGG),IA: SARS-CoV-2 Ab, IgG: NEGATIVE

## 2019-01-02 LAB — URINE CULTURE

## 2019-01-02 LAB — VITAMIN D 25 HYDROXY (VIT D DEFICIENCY, FRACTURES): Vit D, 25-Hydroxy: 23.7 ng/mL — ABNORMAL LOW (ref 30.0–100.0)

## 2019-01-02 MED ORDER — NITROFURANTOIN MONOHYD MACRO 100 MG PO CAPS: 100.0000 mg | ORAL_CAPSULE | Freq: Two times a day (BID) | ORAL | 0 refills | Status: DC

## 2019-01-02 NOTE — Telephone Encounter (Signed)
Pt called stating bactrim DS is causing GI distress.  She has gram negative rods growing in urine culture.  She was called yesterday and rx called in for her.  Would like to use something else.  She is not having any other issues.    Macrobid 100mg  bid sent to pharmacy on file.  Final results will be called to pt.

## 2019-01-04 ENCOUNTER — Other Ambulatory Visit: Payer: Self-pay | Admitting: *Deleted

## 2019-01-04 MED ORDER — CIPROFLOXACIN HCL 500 MG PO TABS: 500.0000 mg | ORAL_TABLET | Freq: Two times a day (BID) | ORAL | 0 refills | Status: AC

## 2019-01-04 NOTE — Progress Notes (Signed)
Rx sent to pharmacy   

## 2019-01-11 ENCOUNTER — Other Ambulatory Visit: Payer: Self-pay

## 2019-01-12 ENCOUNTER — Ambulatory Visit (INDEPENDENT_AMBULATORY_CARE_PROVIDER_SITE_OTHER): Payer: BC Managed Care – PPO

## 2019-01-12 VITALS — BP 110/72 | HR 64 | Resp 16 | Wt 233.0 lb

## 2019-01-12 DIAGNOSIS — Z87440 Personal history of urinary (tract) infections: Secondary | ICD-10-CM

## 2019-01-12 NOTE — Progress Notes (Signed)
Patient is here for urine culture.

## 2019-01-13 LAB — URINE CULTURE

## 2019-08-02 ENCOUNTER — Other Ambulatory Visit: Payer: Self-pay | Admitting: Certified Nurse Midwife

## 2019-08-02 DIAGNOSIS — Z1231 Encounter for screening mammogram for malignant neoplasm of breast: Secondary | ICD-10-CM

## 2019-08-25 ENCOUNTER — Encounter: Payer: Self-pay | Admitting: Certified Nurse Midwife

## 2019-08-25 ENCOUNTER — Other Ambulatory Visit: Payer: Self-pay

## 2019-08-25 ENCOUNTER — Ambulatory Visit (INDEPENDENT_AMBULATORY_CARE_PROVIDER_SITE_OTHER): Payer: BC Managed Care – PPO | Admitting: Certified Nurse Midwife

## 2019-08-25 VITALS — BP 110/80 | HR 68 | Temp 97.2°F | Resp 16 | Ht 66.0 in | Wt 233.0 lb

## 2019-08-25 DIAGNOSIS — Z01419 Encounter for gynecological examination (general) (routine) without abnormal findings: Secondary | ICD-10-CM

## 2019-08-25 DIAGNOSIS — Z8619 Personal history of other infectious and parasitic diseases: Secondary | ICD-10-CM

## 2019-08-25 DIAGNOSIS — Z0000 Encounter for general adult medical examination without abnormal findings: Secondary | ICD-10-CM

## 2019-08-25 DIAGNOSIS — E559 Vitamin D deficiency, unspecified: Secondary | ICD-10-CM

## 2019-08-25 MED ORDER — VALACYCLOVIR HCL 1 G PO TABS: ORAL_TABLET | ORAL | 12 refills | Status: DC

## 2019-08-25 NOTE — Progress Notes (Signed)
44 y.o. [id number] Divorced Biracial Fe here for annual exam. Contraception Mirena IUD inserted 05/27/18. No periods or spotting with IUD. Sees Dr Volanda Napoleon prn. Screening labs today. 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 No partner change or STD screening needed. Screening labs today. No other health issues today.  No LMP recorded. (Menstrual status: IUD).          Sexually active: Yes.    The current method of family planning is IUD.    Exercising: No.  exercise Smoker:  no  Review of Systems  Constitutional: Negative.   HENT: Negative.   Eyes: Negative.   Respiratory: Negative.   Cardiovascular: Negative.   Gastrointestinal: Negative.   Genitourinary: Negative.   Musculoskeletal: Negative.   Skin: Negative.   Neurological: Negative.   Endo/Heme/Allergies: Negative.   Psychiatric/Behavioral: Negative.     Health Maintenance: Pap:  07-31-15 neg HPV HR neg, 08-11-17 neg HPV HR neg, 08-29-2018 neg HPV HR neg History of Abnormal Pap: yes MMG:  09-15-2018 category b density birads 1:neg Self Breast exams: yes Colonoscopy: none BMD:   none TDaP:  2019 Shingles: no Pneumonia: no Hep C and HIV: both neg 2020 Labs: screening labs today   reports that she has never smoked. She has never used smokeless tobacco. She reports current alcohol use of about 1.0 - 2.0 standard drinks of alcohol per week. She reports that she does not use drugs.  Past Medical History:  Diagnosis Date  . Abnormal Pap smear of cervix 2006  . Allergy   . Anxiety   . Asthma   . Depression   . Headache   . History of migraine   . Hx of migraines   . STD (sexually transmitted disease) 20 years ago   [place]    Past Surgical History:  Procedure Laterality Date  . APPENDECTOMY  01/25/2017  . BREAST BIOPSY     x2  . BREAST EXCISIONAL BIOPSY Bilateral    Scars are within reducion scars   . BREAST REDUCTION SURGERY  2003    H to DD  . COLPOSCOPY     2006 or 2007  . INTRAUTERINE DEVICE (IUD) INSERTION   11/27/2013   11-27-13 Mirena inserted & removed 02-18-18, 05-27-18 mirena iud inserted  . 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 LAPAROSCOPIC APPENDECTOMY N/A 01/25/2017   Procedure: APPENDECTOMY LAPAROSCOPIC;  Surgeon: Greer Pickerel, MD;  Location: [facility];  Service: General;  Laterality: N/A;  . REDUCTION MAMMAPLASTY Bilateral     Current Outpatient Medications  Medication Sig Dispense Refill  . levonorgestrel (MIRENA) 20 MCG/24HR IUD 1 each by Intrauterine route once.    . valACYclovir (VALTREX) 1000 MG tablet Two tablets bid x 1 day at onset of oral outbreaks (Patient not taking: Reported on 08/25/2019) 30 tablet 1   No current facility-administered medications for this visit.    Family History  Problem Relation Age of Onset  . Alcohol abuse Mother   . Alcohol abuse Father   . Drug abuse Father   . Depression Father   . Bipolar disorder Father   . Diabetes Father   . Breast cancer Maternal Grandmother     ROS:  Pertinent items are noted in HPI.  Otherwise, a comprehensive ROS was negative.  Exam:   BP 110/80   Pulse 68   Temp (!) 97.2 F (36.2 C) (Skin)   Resp 16   Ht 5\' 6"  (1.676 m)   Wt 233 lb (105.7 kg)  BMI 37.61 kg/m  Height: 5\' 6"  (167.6 cm) Ht Readings from Last 3 Encounters:  08/25/19 5\' 6"  (1.676 m)  12/30/18 5' 6.25" (1.683 m)  11/03/18 5' 6.25" (1.683 m)    General appearance: alert, cooperative and appears stated age Head: Normocephalic, without obvious abnormality, atraumatic Neck: no adenopathy, supple, symmetrical, trachea midline and thyroid normal to inspection and palpation  Lungs: clear to auscultation bilaterally Breasts: normal appearance, no masses or tenderness, No nipple retraction or dimpling, No nipple discharge or bleeding, No axillary or supraclavicular adenopathy Heart: regular rate and rhythm Abdomen: soft, non-tender; no masses,  no organomegaly Extremities: extremities normal, atraumatic, no cyanosis or edema Skin: Skin color,  texture, turgor normal. No rashes or lesions Lymph nodes: Cervical, supraclavicular, and axillary nodes normal. No abnormal inguinal nodes palpated Neurologic: Grossly normal   Pelvic: External genitalia:  no lesions              Urethra:  normal appearing urethra with no masses, tenderness or lesions              Bartholin's and Skene's: normal                 Vagina: normal appearing vagina with normal color and discharge, no lesions              Cervix: multiparous appearance, no cervical motion tenderness and no lesions              Pap taken: No. Bimanual Exam:  Uterus:  normal size, contour, position, consistency, mobility, non-tender and anteverted              Adnexa: normal adnexa and no mass, fullness, tenderness               Rectovaginal: Confirms               Anus:  normal sphincter tone, no lesions  Chaperone present: yes  A:  Well Woman with normal exam  Contraception Mirena IUD  History of HSV  Screening labs  P:   Reviewed health and wellness pertinent to exam  Reviewed warning with signs with IUD use and need to advise if occurs.   Needs update of Valtrex  Rx Valtrex see order with instructions  Labs: CBC,CMP,TSH,Lipid panel, Vitamin D  Pap smear: no   counseled on breast self exam, mammography screening, STD prevention, HIV risk factors and prevention, feminine hygiene, adequate intake of calcium and vitamin D, diet and exercise  return annually or prn  An After Visit Summary was printed and given to the patient.

## 2019-08-25 NOTE — Patient Instructions (Signed)

## 2019-08-26 ENCOUNTER — Other Ambulatory Visit: Payer: Self-pay | Admitting: Certified Nurse Midwife

## 2019-08-26 DIAGNOSIS — E559 Vitamin D deficiency, unspecified: Secondary | ICD-10-CM

## 2019-08-26 DIAGNOSIS — R6889 Other general symptoms and signs: Secondary | ICD-10-CM

## 2019-08-26 LAB — COMPREHENSIVE METABOLIC PANEL
ALT: 38 IU/L — ABNORMAL HIGH (ref 0–32)
AST: 34 IU/L (ref 0–40)
Albumin/Globulin Ratio: 1.3 (ref 1.2–2.2)
Albumin: 4 g/dL (ref 3.8–4.8)
Alkaline Phosphatase: 67 IU/L (ref 39–117)
BUN/Creatinine Ratio: 10 (ref 9–23)
BUN: 7 mg/dL (ref 6–24)
Bilirubin Total: <0.2 mg/dL (ref 0.0–1.2)
CO2: 24 mmol/L (ref 20–29)
Calcium: 9 mg/dL (ref 8.7–10.2)
Chloride: 103 mmol/L (ref 96–106)
Creatinine, Ser: 0.7 mg/dL (ref 0.57–1.00)
GFR calc Af Amer: 124 mL/min/{1.73_m2} (ref >59)
GFR calc non Af Amer: 107 mL/min/{1.73_m2} (ref >59)
Globulin, Total: 3.1 g/dL (ref 1.5–4.5)
Glucose: 59 mg/dL — ABNORMAL LOW (ref 65–99)
Potassium: 4 mmol/L (ref 3.5–5.2)
Sodium: 138 mmol/L (ref 134–144)
Total Protein: 7.1 g/dL (ref 6.0–8.5)

## 2019-08-26 LAB — CBC
Hematocrit: 40.2 % (ref 34.0–46.6)
Hemoglobin: 13.8 g/dL (ref 11.1–15.9)
MCH: 28.9 pg (ref 26.6–33.0)
MCHC: 34.3 g/dL (ref 31.5–35.7)
MCV: 84 fL (ref 79–97)
Platelets: 319 10*3/uL (ref 150–450)
RBC: 4.78 x10E6/uL (ref 3.77–5.28)
RDW: 12.7 % (ref 11.7–15.4)
WBC: 8.7 10*3/uL (ref 3.4–10.8)

## 2019-08-26 LAB — LIPID PANEL
Chol/HDL Ratio: 4.6 ratio — ABNORMAL HIGH (ref 0.0–4.4)
Cholesterol, Total: 212 mg/dL — ABNORMAL HIGH (ref 100–199)
HDL: 46 mg/dL (ref >39)
LDL Chol Calc (NIH): 140 mg/dL — ABNORMAL HIGH (ref 0–99)
Triglycerides: 144 mg/dL (ref 0–149)
VLDL Cholesterol Cal: 26 mg/dL (ref 5–40)

## 2019-08-26 LAB — VITAMIN D 25 HYDROXY (VIT D DEFICIENCY, FRACTURES): Vit D, 25-Hydroxy: 12.4 ng/mL — ABNORMAL LOW (ref 30.0–100.0)

## 2019-08-26 LAB — TSH: TSH: 1.98 u[IU]/mL (ref 0.450–4.500)

## 2019-08-29 ENCOUNTER — Other Ambulatory Visit: Payer: Self-pay

## 2019-08-29 DIAGNOSIS — E559 Vitamin D deficiency, unspecified: Secondary | ICD-10-CM

## 2019-08-29 MED ORDER — VITAMIN D (ERGOCALCIFEROL) 1.25 MG (50000 UNIT) PO CAPS: 50000.0000 [IU] | ORAL_CAPSULE | ORAL | 0 refills | Status: DC

## 2019-08-29 NOTE — Progress Notes (Signed)
Vit D 3 50000 IU sent to pharmacy on file per Johny Shock, CNM orders on result note 08/26/2019.

## 2019-09-18 ENCOUNTER — Other Ambulatory Visit: Payer: Self-pay

## 2019-09-18 ENCOUNTER — Ambulatory Visit
Admission: RE | Admit: 2019-09-18 | Discharge: 2019-09-18 | Disposition: A | Discharge status: Home / Self Care | Payer: BC Managed Care – PPO | Source: Ambulatory Visit | Attending: Certified Nurse Midwife | Admitting: Certified Nurse Midwife

## 2019-09-18 DIAGNOSIS — Z1231 Encounter for screening mammogram for malignant neoplasm of breast: Secondary | ICD-10-CM

## 2019-10-05 ENCOUNTER — Other Ambulatory Visit: Payer: Self-pay

## 2019-10-05 ENCOUNTER — Ambulatory Visit: Payer: BC Managed Care – PPO | Attending: Family

## 2019-10-05 DIAGNOSIS — Z23 Encounter for immunization: Secondary | ICD-10-CM | POA: Insufficient documentation

## 2019-10-05 NOTE — Progress Notes (Signed)
   Covid-19 Vaccination Clinic  Name:  Audrey Jackson    MRN: [id number] DOB: 06-01-1976  10/05/2019  [name] was observed post Covid-19 immunization for 15 minutes without incidence. She was provided with Vaccine Information Sheet and instruction to access the V-Safe system.   [name] was instructed to call 911 with any severe reactions post vaccine: [name] Kitchen Difficulty breathing  . Swelling of your face and throat  . A fast heartbeat  . A bad rash all over your body  . Dizziness and weakness    Immunizations Administered    Name Date Dose VIS Date Route   Moderna COVID-19 Vaccine 10/05/2019  4:24 PM 0.5 mL 07/11/2019 Intramuscular   Manufacturer: Moderna   [id number]   [place]:[id number]

## 2019-10-17 ENCOUNTER — Other Ambulatory Visit: Payer: Self-pay | Admitting: Certified Nurse Midwife

## 2019-10-17 DIAGNOSIS — R6889 Other general symptoms and signs: Secondary | ICD-10-CM

## 2019-10-27 ENCOUNTER — Encounter: Payer: Self-pay | Admitting: Certified Nurse Midwife

## 2019-10-27 ENCOUNTER — Telehealth: Payer: Self-pay | Admitting: Certified Nurse Midwife

## 2019-10-27 NOTE — Telephone Encounter (Signed)
Patient states she received a message from Melvia Heaps that says she needed to schedule a lab appointment. Patient states she already is scheduled 11/20/19 for labs. Patient wants to know if she needs to come in sooner for labs?Or is her appointment in April okay.

## 2019-10-27 NOTE — Telephone Encounter (Signed)
Spoke with patient. Patient is scheduled for repeat labs on 11/20/19, ALT, lipid panel and Vit D.  Advised patient ok to proceed as scheduled. This is in f/u to 08/25/19 labs.   Routing to provider for final review. Patient is agreeable to disposition. Will close encounter.

## 2019-11-07 ENCOUNTER — Ambulatory Visit: Payer: BC Managed Care – PPO | Attending: Family

## 2019-11-07 DIAGNOSIS — Z23 Encounter for immunization: Secondary | ICD-10-CM

## 2019-11-07 NOTE — Progress Notes (Signed)
   Covid-19 Vaccination Clinic  Name:  Kandrea Brinkerhoff    MRN: [id number] DOB: November 21, 1975  11/07/2019  Ms. Lighter was observed post Covid-19 immunization for 15 minutes without incident. She was provided with Vaccine Information Sheet and instruction to access the V-Safe system.   Ms. Kerstiens was instructed to call 911 with any severe reactions post vaccine: [name] Kitchen Difficulty breathing  . Swelling of face and throat  . A fast heartbeat  . A bad rash all over body  . Dizziness and weakness   Immunizations Administered    Name Date Dose VIS Date Route   Moderna COVID-19 Vaccine 11/07/2019  4:21 PM 0.5 mL 07/11/2019 Intramuscular   Manufacturer: Moderna   Lot: [id number]   [id number]

## 2019-11-20 ENCOUNTER — Other Ambulatory Visit: Payer: Self-pay

## 2019-11-20 ENCOUNTER — Other Ambulatory Visit (INDEPENDENT_AMBULATORY_CARE_PROVIDER_SITE_OTHER): Payer: BC Managed Care – PPO

## 2019-11-20 DIAGNOSIS — R6889 Other general symptoms and signs: Secondary | ICD-10-CM

## 2019-11-20 DIAGNOSIS — E559 Vitamin D deficiency, unspecified: Secondary | ICD-10-CM

## 2019-11-21 LAB — ALT: ALT: 18 IU/L (ref 0–32)

## 2019-11-21 LAB — LIPID PANEL
Chol/HDL Ratio: 5.2 ratio — ABNORMAL HIGH (ref 0.0–4.4)
Cholesterol, Total: 219 mg/dL — ABNORMAL HIGH (ref 100–199)
HDL: 42 mg/dL (ref >39)
LDL Chol Calc (NIH): 155 mg/dL — ABNORMAL HIGH (ref 0–99)
Triglycerides: 123 mg/dL (ref 0–149)
VLDL Cholesterol Cal: 22 mg/dL (ref 5–40)

## 2019-11-21 LAB — VITAMIN D 25 HYDROXY (VIT D DEFICIENCY, FRACTURES): Vit D, 25-Hydroxy: 28.6 ng/mL — ABNORMAL LOW (ref 30.0–100.0)

## 2019-11-28 ENCOUNTER — Telehealth: Payer: Self-pay

## 2019-11-28 DIAGNOSIS — E559 Vitamin D deficiency, unspecified: Secondary | ICD-10-CM

## 2019-11-28 NOTE — Telephone Encounter (Signed)
Audrey Dom, MD  [facility] Triage Pool  Please let the patient know that her vit d level has improved, but is still low. Please continue her on vit D3 50,000 IU q 7 days for the next 3 months. She should repeat her lvit d level in 3 months.    Left message to call [place] at [phone].

## 2019-11-30 MED ORDER — VITAMIN D (ERGOCALCIFEROL) 1.25 MG (50000 UNIT) PO CAPS: 50000.0000 [IU] | ORAL_CAPSULE | ORAL | 0 refills | Status: AC

## 2019-11-30 NOTE — Telephone Encounter (Signed)
Spoke with patient, advised as seen below per Dr. Talbert Nan.  Lab appt scheduled for 03/04/20 at 11:15am.  Future lab order placed.  Rx to verified pharmacy. Contraindication with Vit D and Soy. Patient reports she has completed previous RX for Vit D with no interactions or reaction.   Routing to provider for final review. Patient is agreeable to disposition. Will close encounter.

## 2020-02-20 ENCOUNTER — Other Ambulatory Visit: Payer: Self-pay | Admitting: Obstetrics and Gynecology

## 2020-02-20 DIAGNOSIS — E559 Vitamin D deficiency, unspecified: Secondary | ICD-10-CM

## 2020-03-04 ENCOUNTER — Other Ambulatory Visit: Payer: BC Managed Care – PPO

## 2020-04-24 ENCOUNTER — Other Ambulatory Visit: Payer: Self-pay | Admitting: Obstetrics and Gynecology

## 2020-04-24 DIAGNOSIS — E559 Vitamin D deficiency, unspecified: Secondary | ICD-10-CM

## 2020-04-24 NOTE — Telephone Encounter (Signed)
Medication refill request: Vitamin D 50000 iu Last AEX:  08/25/19  Next AEX: not yet scheduled Last MMG (if hormonal medication request): NA Refill authorized: 12/0

## 2020-04-25 NOTE — Telephone Encounter (Signed)
She needs another vit d level, I'm hoping we will be able to lower her dose.

## 2020-04-26 NOTE — Telephone Encounter (Signed)
Message left to return call to Nickholas Goldston at [phone].    

## 2020-05-02 NOTE — Telephone Encounter (Signed)
Spoke with pt. Pt states does not need refill of Vit D. Pt states has not taken Rx Vit D 50 K in 3 weeks. Pt given recommendations per Dr Talbert Nan for repeat vit D level. Pt states not a good time to schedule and will call back to schedule vit D Level.

## 2020-05-07 NOTE — Telephone Encounter (Signed)
Routing to Dr Talbert Nan for review and update

## 2020-06-03 ENCOUNTER — Other Ambulatory Visit: Payer: Self-pay | Admitting: Family

## 2020-06-03 DIAGNOSIS — Z8619 Personal history of other infectious and parasitic diseases: Secondary | ICD-10-CM

## 2020-06-03 MED ORDER — VALACYCLOVIR HCL 1 G PO TABS: ORAL_TABLET | ORAL | 1 refills | Status: DC

## 2020-06-14 ENCOUNTER — Ambulatory Visit: Payer: Self-pay | Attending: Family

## 2020-06-14 ENCOUNTER — Ambulatory Visit: Payer: BC Managed Care – PPO

## 2020-06-14 DIAGNOSIS — Z23 Encounter for immunization: Secondary | ICD-10-CM

## 2020-06-17 ENCOUNTER — Other Ambulatory Visit: Payer: Self-pay | Admitting: Family

## 2020-06-17 DIAGNOSIS — Z8619 Personal history of other infectious and parasitic diseases: Secondary | ICD-10-CM

## 2020-08-20 ENCOUNTER — Other Ambulatory Visit: Payer: Self-pay | Admitting: Family

## 2020-08-20 DIAGNOSIS — Z8619 Personal history of other infectious and parasitic diseases: Secondary | ICD-10-CM

## 2020-08-30 ENCOUNTER — Ambulatory Visit: Payer: BC Managed Care – PPO | Admitting: Certified Nurse Midwife

## 2020-09-01 NOTE — Progress Notes (Signed)
   Covid-19 Vaccination Clinic  Name:  Shiron Whetsel    MRN: [id number] DOB: 08/08/1976  09/01/2020  Ms. Mesler was observed post Covid-19 immunization for 15 minutes without incident. She was provided with Vaccine Information Sheet and instruction to access the V-Safe system.   Ms. Haver was instructed to call 911 with any severe reactions post vaccine: [name] Kitchen Difficulty breathing  . Swelling of face and throat  . A fast heartbeat  . A bad rash all over body  . Dizziness and weakness   Immunizations Administered    Name Date Dose VIS Date Route   Moderna Covid-19 Booster Vaccine 06/14/2020 10:20 AM 0.25 mL 05/29/2020 Intramuscular   Manufacturer: Moderna   Lot: [id number]   [place]: [id number]

## 2020-09-08 ENCOUNTER — Other Ambulatory Visit: Payer: Self-pay | Admitting: Family

## 2020-09-08 DIAGNOSIS — Z8619 Personal history of other infectious and parasitic diseases: Secondary | ICD-10-CM

## 2020-09-09 ENCOUNTER — Other Ambulatory Visit: Payer: Self-pay | Admitting: Family

## 2020-09-09 DIAGNOSIS — Z8619 Personal history of other infectious and parasitic diseases: Secondary | ICD-10-CM

## 2020-09-10 ENCOUNTER — Other Ambulatory Visit: Payer: Self-pay | Admitting: Family

## 2020-09-10 DIAGNOSIS — Z8619 Personal history of other infectious and parasitic diseases: Secondary | ICD-10-CM

## 2021-02-03 ENCOUNTER — Other Ambulatory Visit: Payer: Self-pay | Admitting: Family

## 2021-02-03 MED ORDER — CIPROFLOXACIN HCL 250 MG PO TABS: 250.0000 mg | ORAL_TABLET | Freq: Two times a day (BID) | ORAL | 0 refills | Status: AC

## 2021-05-21 NOTE — Telephone Encounter (Signed)
Spoke with Clair Gulling at [facility] and informed the Rx was refused.

## 2022-01-04 IMAGING — MG DIGITAL SCREENING BILAT W/ TOMO W/ CAD
6 of 12 series · 6 of 36 positions shown · non-contrast
Comparison: Previous exam(s).

CLINICAL DATA: Screening.

EXAM:
DIGITAL SCREENING BILATERAL MAMMOGRAM WITH TOMO AND CAD

[L MLO synth-2D (1 of 2)]
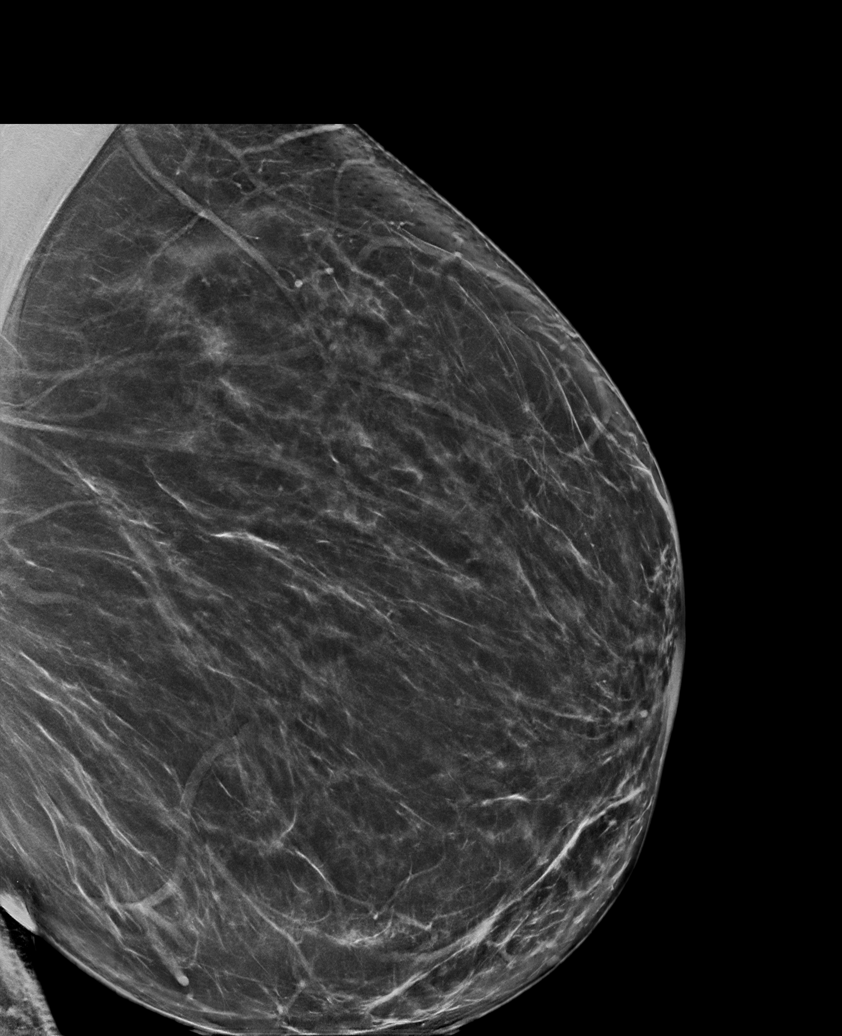

[R CC synth-2D]
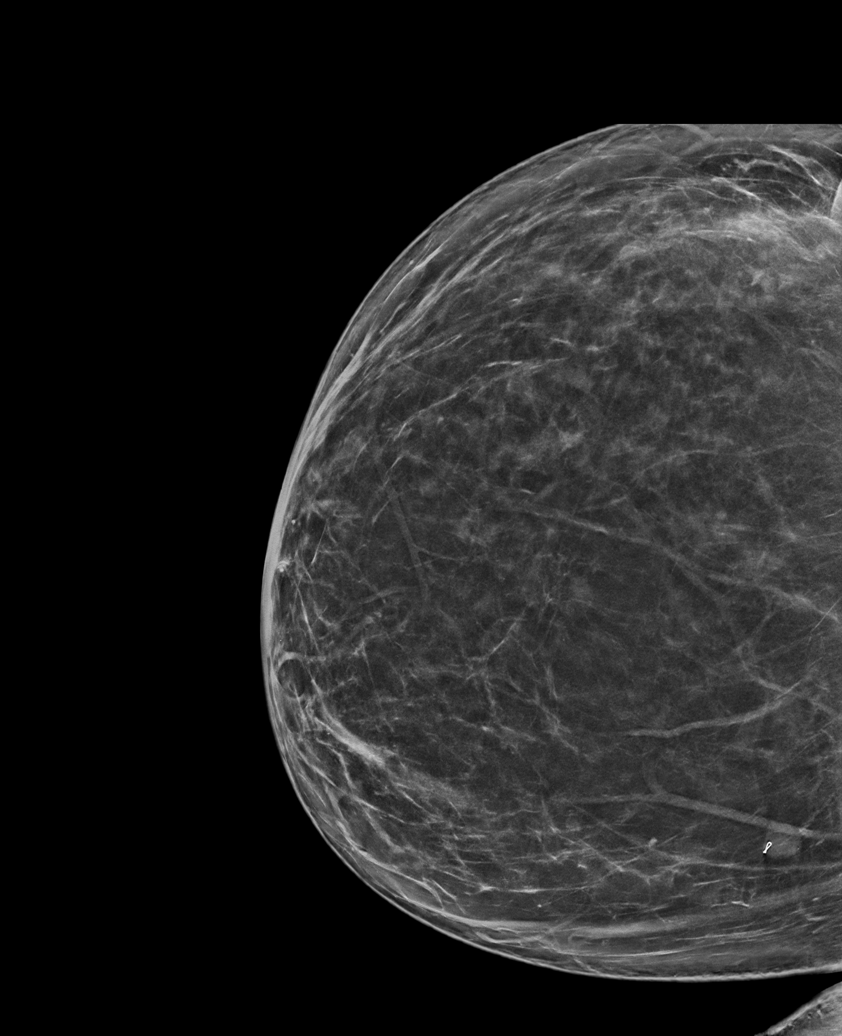

[L MLO synth-2D (2 of 2)]
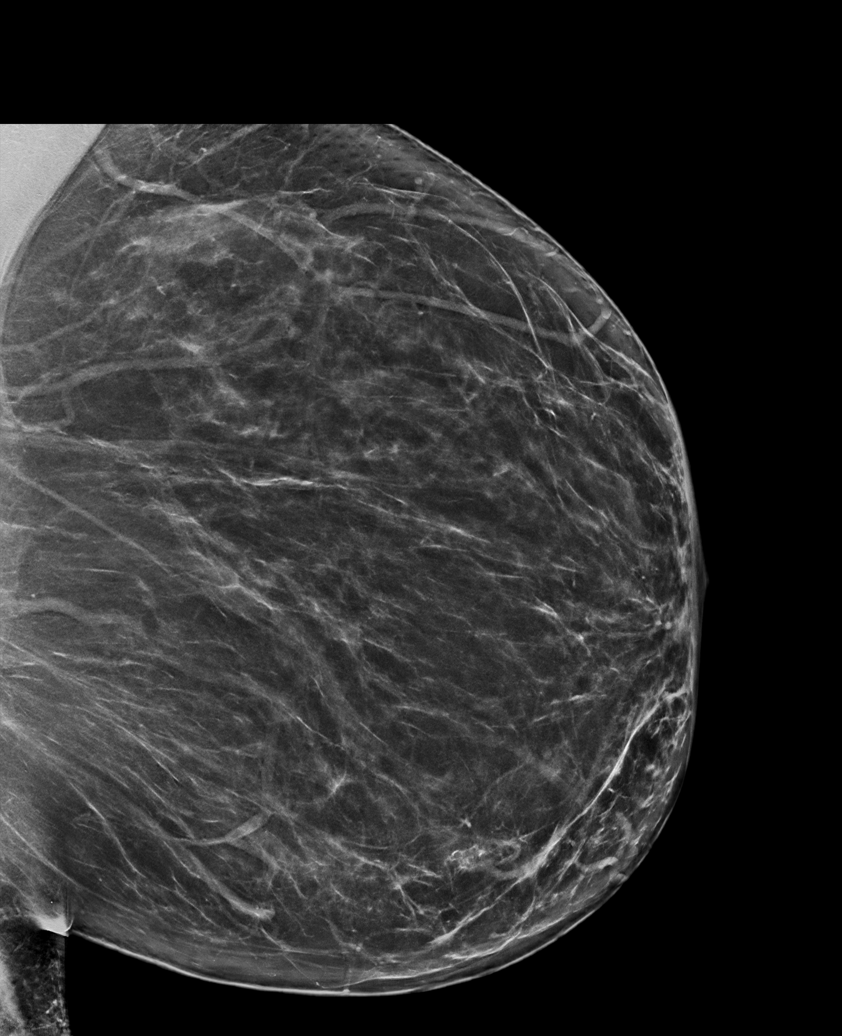

[R MLO synth-2D (1 of 2)]
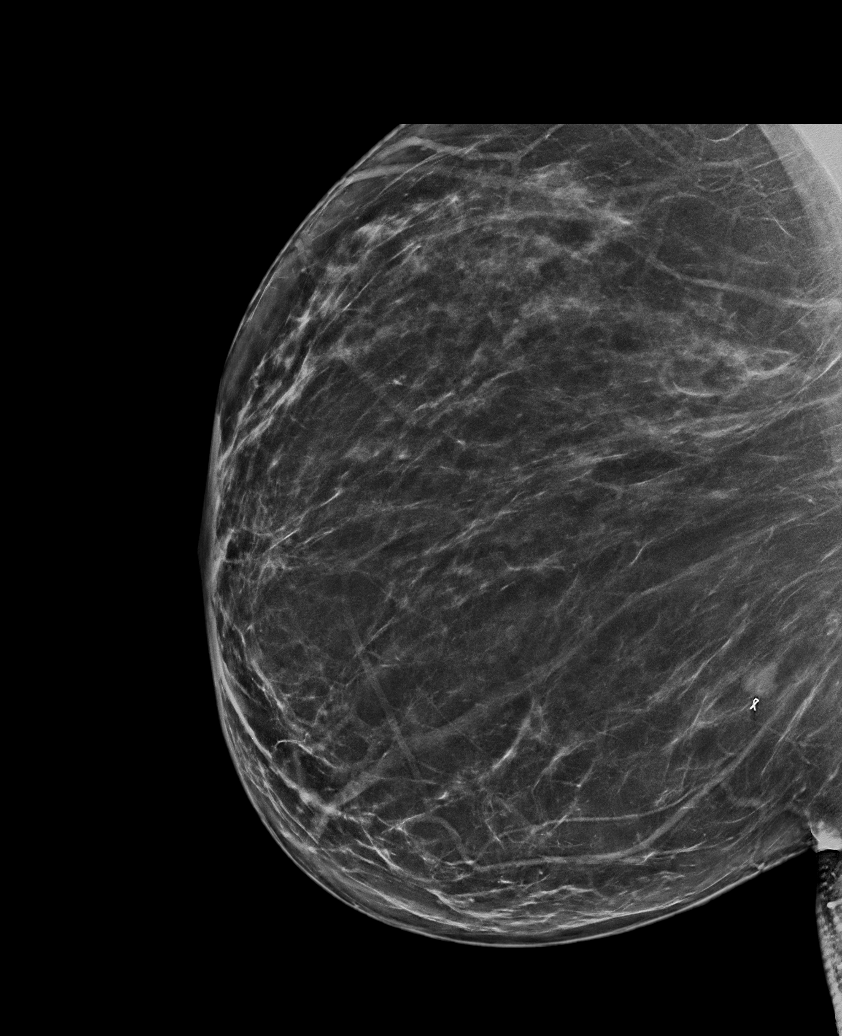

[R MLO synth-2D (2 of 2)]
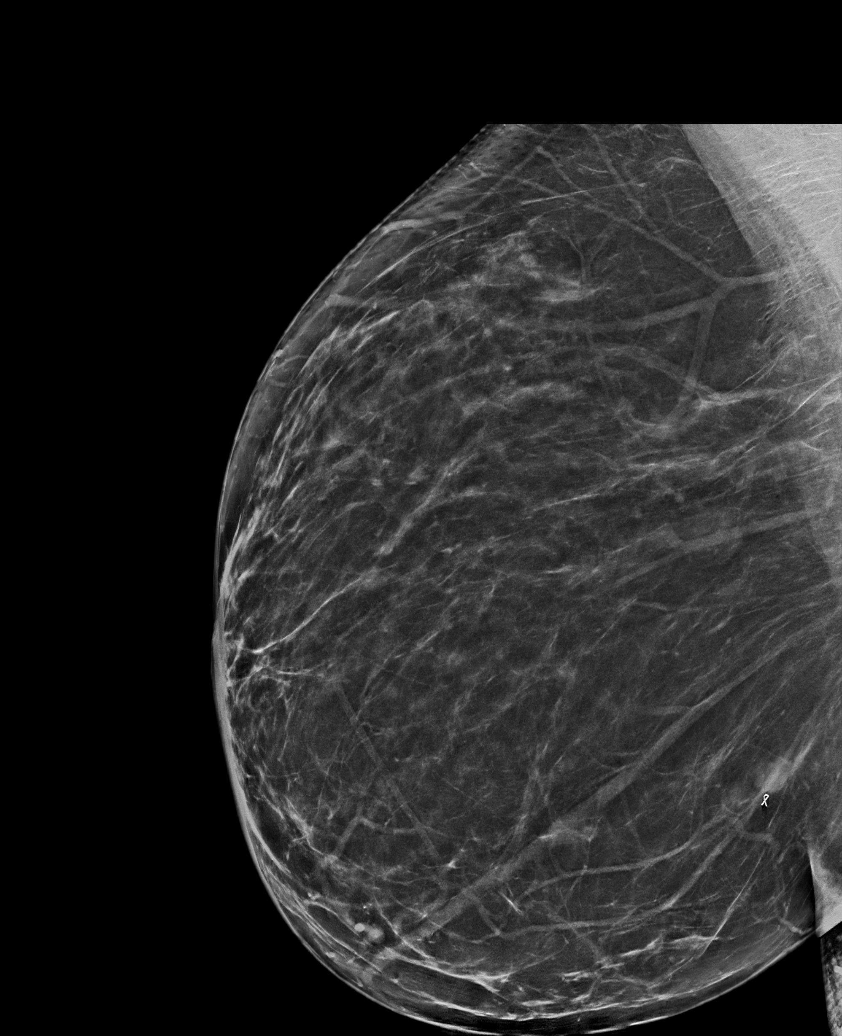

[L CC synth-2D]
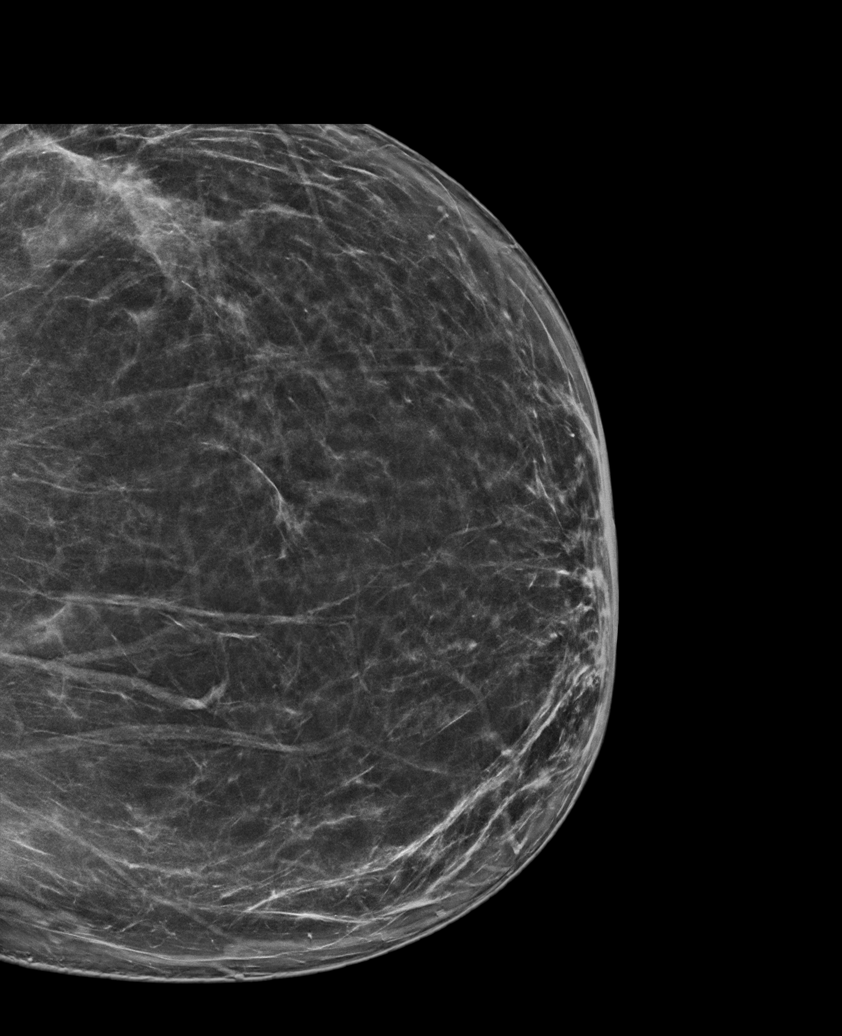

[6 of 36 positions shown; findings below may reference images not displayed]

ACR Breast Density Category b: There are scattered areas of
fibroglandular density.
FINDINGS: There are no findings suspicious for malignancy. Images were
processed with CAD.
IMPRESSION: No mammographic evidence of malignancy. A result letter of this
screening mammogram will be mailed directly to the patient.

RECOMMENDATION:
Screening mammogram in one year. (Code:[id number])

BI-RADS CATEGORY  1: Negative.
